# Patient Record
Sex: Female | Born: 1935
Health system: Southern US, Community
[De-identification: ages and names within clinical notes are randomized; demographics above are authoritative.]

## PROBLEM LIST (undated history)

## (undated) DIAGNOSIS — I10 Essential (primary) hypertension: Secondary | ICD-10-CM

## (undated) DIAGNOSIS — Z9114 Patient's other noncompliance with medication regimen: Secondary | ICD-10-CM

## (undated) DIAGNOSIS — Z91148 Patient's other noncompliance with medication regimen for other reason: Secondary | ICD-10-CM

---

## 2011-12-02 ENCOUNTER — Other Ambulatory Visit: Payer: Self-pay | Admitting: Family Medicine

## 2011-12-02 DIAGNOSIS — R1011 Right upper quadrant pain: Secondary | ICD-10-CM

## 2011-12-03 ENCOUNTER — Other Ambulatory Visit (HOSPITAL_COMMUNITY): Payer: Self-pay

## 2011-12-03 ENCOUNTER — Other Ambulatory Visit: Payer: Self-pay

## 2011-12-05 ENCOUNTER — Other Ambulatory Visit: Payer: Self-pay | Admitting: Family Medicine

## 2011-12-05 ENCOUNTER — Ambulatory Visit
Admission: RE | Admit: 2011-12-05 | Discharge: 2011-12-05 | Disposition: A | Discharge status: Home / Self Care | Payer: PRIVATE HEALTH INSURANCE | Source: Ambulatory Visit | Attending: Family Medicine | Admitting: Family Medicine

## 2011-12-05 DIAGNOSIS — R1011 Right upper quadrant pain: Secondary | ICD-10-CM

## 2011-12-12 ENCOUNTER — Other Ambulatory Visit: Payer: Self-pay | Admitting: Family Medicine

## 2011-12-12 DIAGNOSIS — J984 Other disorders of lung: Secondary | ICD-10-CM

## 2011-12-16 ENCOUNTER — Ambulatory Visit
Admission: RE | Admit: 2011-12-16 | Discharge: 2011-12-16 | Disposition: A | Discharge status: Home / Self Care | Payer: PRIVATE HEALTH INSURANCE | Source: Ambulatory Visit | Attending: Family Medicine | Admitting: Family Medicine

## 2011-12-16 DIAGNOSIS — J984 Other disorders of lung: Secondary | ICD-10-CM

## 2011-12-16 MED ORDER — IOHEXOL 300 MG/ML  SOLN
75.0000 mL | Freq: Once | INTRAMUSCULAR | Status: AC | PRN
  Administered 2011-12-16: 75 mL via INTRAVENOUS

## 2013-07-05 ENCOUNTER — Other Ambulatory Visit: Payer: Self-pay | Admitting: Family Medicine

## 2013-07-05 DIAGNOSIS — I7121 Aneurysm of the ascending aorta, without rupture: Secondary | ICD-10-CM

## 2013-07-05 DIAGNOSIS — I712 Thoracic aortic aneurysm, without rupture: Secondary | ICD-10-CM

## 2013-07-14 ENCOUNTER — Other Ambulatory Visit: Payer: PRIVATE HEALTH INSURANCE

## 2014-01-09 ENCOUNTER — Encounter (HOSPITAL_COMMUNITY): Payer: Self-pay | Admitting: Emergency Medicine

## 2014-01-09 ENCOUNTER — Emergency Department (HOSPITAL_COMMUNITY): Payer: PRIVATE HEALTH INSURANCE

## 2014-01-09 ENCOUNTER — Emergency Department (INDEPENDENT_AMBULATORY_CARE_PROVIDER_SITE_OTHER)
Admission: EM | Admit: 2014-01-09 | Discharge: 2014-01-09 | Disposition: A | Discharge status: Nursing Home (Includes ICF) | Payer: PRIVATE HEALTH INSURANCE | Source: Home / Self Care | Attending: Family Medicine | Admitting: Family Medicine

## 2014-01-09 ENCOUNTER — Emergency Department (HOSPITAL_COMMUNITY)
Admission: EM | Admit: 2014-01-09 | Discharge: 2014-01-09 | Disposition: A | Discharge status: Home / Self Care | Payer: PRIVATE HEALTH INSURANCE | Attending: Emergency Medicine | Admitting: Emergency Medicine

## 2014-01-09 DIAGNOSIS — I10 Essential (primary) hypertension: Secondary | ICD-10-CM | POA: Insufficient documentation

## 2014-01-09 DIAGNOSIS — R509 Fever, unspecified: Secondary | ICD-10-CM | POA: Diagnosis not present

## 2014-01-09 DIAGNOSIS — M47812 Spondylosis without myelopathy or radiculopathy, cervical region: Secondary | ICD-10-CM

## 2014-01-09 DIAGNOSIS — M542 Cervicalgia: Secondary | ICD-10-CM | POA: Diagnosis present

## 2014-01-09 DIAGNOSIS — M5137 Other intervertebral disc degeneration, lumbosacral region: Secondary | ICD-10-CM | POA: Insufficient documentation

## 2014-01-09 DIAGNOSIS — J189 Pneumonia, unspecified organism: Secondary | ICD-10-CM

## 2014-01-09 LAB — CBC WITH DIFFERENTIAL/PLATELET
BASOS ABS: 0 10*3/uL (ref 0.0–0.1)
BASOS PCT: 0 % (ref 0–1)
EOS ABS: 0 10*3/uL (ref 0.0–0.7)
Eosinophils Relative: 0 % (ref 0–5)
HCT: 40.3 % (ref 36.0–46.0)
Hemoglobin: 13 g/dL (ref 12.0–15.0)
Lymphocytes Relative: 14 % (ref 12–46)
Lymphs Abs: 1.3 10*3/uL (ref 0.7–4.0)
MCH: 22.3 pg — ABNORMAL LOW (ref 26.0–34.0)
MCHC: 32.3 g/dL (ref 30.0–36.0)
MCV: 69.2 fL — ABNORMAL LOW (ref 78.0–100.0)
Monocytes Absolute: 0.5 10*3/uL (ref 0.1–1.0)
Monocytes Relative: 5 % (ref 3–12)
NEUTROS ABS: 7.8 10*3/uL — AB (ref 1.7–7.7)
NEUTROS PCT: 81 % — AB (ref 43–77)
PLATELETS: 213 10*3/uL (ref 150–400)
RBC: 5.82 MIL/uL — ABNORMAL HIGH (ref 3.87–5.11)
RDW: 14 % (ref 11.5–15.5)
WBC: 9.7 10*3/uL (ref 4.0–10.5)

## 2014-01-09 LAB — BASIC METABOLIC PANEL
ANION GAP: 12 (ref 5–15)
BUN: 11 mg/dL (ref 6–23)
CO2: 27 mEq/L (ref 19–32)
Calcium: 9.4 mg/dL (ref 8.4–10.5)
Chloride: 97 mEq/L (ref 96–112)
Creatinine, Ser: 0.63 mg/dL (ref 0.50–1.10)
GFR calc Af Amer: >90 mL/min (ref >90)
GFR, EST NON AFRICAN AMERICAN: 84 mL/min — AB (ref >90)
Glucose, Bld: 97 mg/dL (ref 70–99)
POTASSIUM: 3.8 meq/L (ref 3.7–5.3)
SODIUM: 136 meq/L — AB (ref 137–147)

## 2014-01-09 MED ORDER — PREDNISONE 20 MG PO TABS: 20.0000 mg | ORAL_TABLET | Freq: Two times a day (BID) | ORAL | Status: DC

## 2014-01-09 MED ORDER — LISINOPRIL 10 MG PO TABS
10.0000 mg | ORAL_TABLET | Freq: Every day | ORAL | Status: DC
Start: 2014-01-09 — End: 2018-04-13

## 2014-01-09 NOTE — ED Provider Notes (Signed)
CSN: [id number]     Arrival date & time 01/09/14  1402 History   First MD Initiated Contact with Patient 01/09/14 1431     Chief Complaint  Patient presents with  . Neck Pain  . Fever     (Consider location/radiation/quality/duration/timing/severity/associated sxs/prior Treatment) Patient is a 80 y.o. female presenting with neck pain and fever. A language interpreter was used (Daughter).  Neck Pain Associated symptoms: fever   Fever  Tracy Kerr is a 80 y.o. female who is here for evaluation of pain in left neck, occasional cough, and evaluation after treatment for bronchitis, 2 weeks ago. She also has intermittent elevated blood pressure, which is not currently being treated. She denies fever. Currently, but did have some chills 2 weeks ago, at which time. She was treated for bronchitis with Zithromax. She's never had an injury to her neck. The pain in her neck is worse with movement to the left, with rotation. She denies weakness, dizziness, paresthesias, or problems walking. She was evaluated at an urgent care center, and sent here for further evaluation to be strained for pneumonia. She denies headache, blurred vision, nausea, or vomiting. She has not taken any medication for the discomfort. There are no other known modifying factors.    History reviewed. No pertinent past medical history. History reviewed. No pertinent past surgical history. History reviewed. No pertinent family history. History  Substance Use Topics  . Smoking status: Never Smoker   . Smokeless tobacco: Not on file  . Alcohol Use: No   OB History   Grav Para Term Preterm Abortions TAB SAB Ect Mult Living                 Review of Systems  Constitutional: Positive for fever.  Musculoskeletal: Positive for neck pain.  All other systems reviewed and are negative.     Allergies  Review of patient's allergies indicates no known allergies.  Home Medications   Prior to Admission medications   Not on File    BP 179/100  Pulse 98  Temp(Src) 98.7 F (37.1 C) (Oral)  Resp 20  SpO2 98% Physical Exam  Nursing note and vitals reviewed. Constitutional: She is oriented to person, place, and time. She appears well-developed and well-nourished. No distress.  HENT:  Head: Normocephalic and atraumatic.  Eyes: Conjunctivae and EOM are normal. Pupils are equal, round, and reactive to light.  Neck: Normal range of motion and phonation normal. Neck supple.  I neck is supple. She can touch her chin to her chest.  Cardiovascular: Normal rate and regular rhythm.   Pulmonary/Chest: Effort normal. She has rales (Bilateral bases). She exhibits no tenderness.  Abdominal: Soft. She exhibits no distension. There is no tenderness. There is no guarding.  Musculoskeletal: Normal range of motion.  There is no palpable tenderness of the cervical, thoracic, or lumbar spines. There is no paravertebral tenderness of the neck. Neck exhibits normal active range of motion.  Neurological: She is alert and oriented to person, place, and time. She exhibits normal muscle tone.  No dysarthria or aphasia.  Skin: Skin is warm and dry.  Psychiatric: She has a normal mood and affect. Her behavior is normal. Judgment and thought content normal.    ED Course  Procedures (including critical care time) Medications - No data to display  Patient Vitals for the past 24 hrs:  BP Temp Temp src Pulse Resp SpO2  01/09/14 1410 179/100 mmHg 98.7 F (37.1 C) Oral 98 20 98 %  Labs Review Labs Reviewed  CBC WITH DIFFERENTIAL  BASIC METABOLIC PANEL    Imaging Review No results found.   EKG Interpretation None      MDM   Final diagnoses:  Neck pain  Degenerative joint disease of cervical spine  Essential hypertension    Nonspecific neck pain, with evidence for djd as source.  Nursing Notes Reviewed/ Care Coordinated Applicable Imaging Reviewed Interpretation of Laboratory Data incorporated into ED  treatment  The patient appears reasonably screened and/or stabilized for discharge and I doubt any other medical condition or other [facility] requiring further screening, evaluation, or treatment in the ED at this time prior to discharge.  Plan: Home Medications- Prednisone; Home Treatments- Heat; return here if the recommended treatment, does not improve the symptoms; Recommended follow up- PCP 1 week    Flint MelterElliott L Tobey Schmelzle, MD 01/11/14 [phone]

## 2014-01-09 NOTE — ED Notes (Signed)
Patient c/o neck stiffness and pain on the left side of her body x 3 weeks. Patients daughter is translating for her. Reports pain originally was on the right side. Has not been taking anything for the pain. Patient is alert and oriented and in NAD.

## 2014-01-09 NOTE — Discharge Instructions (Signed)
Use heat on the sore area in her neck, to help treat the arthritis. He continues he did try heat from a heating pad, or wet heat using a moist compress. After the prednisone runs out, take ibuprofen 400 mg 3 times a day with meals for pain. Use the resource guide to find a primary care doctor to see for a blood pressure check in one to 2 weeks. Do not start taking her blood pressure medicine, and was advised to do so, by a physician.     T?ng huy?t p (Hypertension) T?ng huy?t p, th??ng ???c g?i l huy?t p cao, l khi l?c b?m mu qua ??ng m?ch c?a qu v? qu m?nh. ??ng m?ch c?a qu v? l cc m?ch mu mang mu t? tim ?i kh?p c? th? c?a qu v?. K?t qu? ?o huy?t p c m?t con s? cao v m?t con s? th?p, ch?ng h?n 110/72. Con s? cao (tm thu) l p l?c bn trong ??ng m?ch khi tim qu v? b?m. Con s? th?p (tm tr??ng) l p l?c bn trong ??ng m?ch khi tim qu v? gin ra. Huy?t p l t??ng c?n cho qu v? ph?i l d??i 120/80. Ch?ng t?ng huy?t p bu?c tim qu v? ph?i lm vi?c v?t v? h?n ?? b?m mu. ??ng m?ch c?a qu v? c th? b? h?p ho?c c?ng. Ch?ng t?ng huy?t p lm qu v? c nguy c? b? b?nh tim, ??t qu? v cc v?n ?? khc.  CC Y?U T? NGUY C? M?t s? y?u t? nguy c? d?n ??n huy?t p cao c th? ki?m sot ???c. M?t s? y?u t? khc th khng.  Nh?ng y?u t? nguy c? khng th? ki?m sot ???c bao g?m:   Ch?ng t?c. Qu v? c nguy c? cao h?n n?u qu v? l ng??i M? g?c Phi.  ?? tu?i. Nguy c? t?ng ln theo ?? tu?i.  Gi?i tnh. Nam gi?i c nguy c? cao h?n ph? n? tr??c tu?i 45. Sau tu?i 67, ph? n? c nguy c? cao h?n nam gi?i. Nh?ng y?u t? nguy c? c th? ki?m sot ???c bao g?m:  Khng t?p th? d?c ho?c cc ho?t ??ng th? ch?t ??y ??Marland Kitchen  Th?a cn.  ?n qu nhi?u ch?t bo, ???ng, ca-lo, ho?c mu?i.  U?ng qu nhi?u r??u. D?U HI?U V TRI?U CH?NG T?ng huy?t p th??ng khng gy ra d?u hi?u ho?c tri?u ch?ng. Huy?t p r?t cao (c?n cao huy?t p) c th? gy ?au ??u, lo l?ng, kh th?, v ch?y mu cam. CH?N ?ON  ??  ki?m tra xem qu v? c t?ng huy?t p khng, chuyn gia ch?m [place] s?c kh?e c?a qu v? s? ?o huy?t p trong khi qu v? ng?i ??t tay ? m?c ngang v?i tim. Huy?t p c?n ???c ?o t nh?t hai l?n trn cng m?t cnh tay. M?t s? tnh tr?ng nh?t ??nh c th? lm cho huy?t p khc nhau gi?a tay ph?i v tay tri c?a qu v?. K?t qu? ?o huy?t p cao h?n bnh th??ng ? m?t th?i ?i?m no ? khng c ngh?a l qu v? c?n ?i?u tr?Marland Kitchen N?u k?t qu? ?o huy?t p cao, hy h?i chuyn gia ch?m [place] s?c kh?e v? vi?c ki?m tra l?i huy?t p. ?I?U TR?  ?i?u tr? huy?t p cao gao g?m thay ??i l?i s?ng v c th? ph?i dng thu?c. C m?t l?i s?ng lnh m?nh c th? gip lm gi?m huy?t p cao. Qu v? c th? c?n thay ??i m?t s? thi quen. Thay ??  i l?i s?ng c th? bao g?m:  Th?c hi?n ch? ?? ?n DASH. Ch? ?? ?n ny c nhi?u tri cy, rau, v ng? c?c nguyn h?t. C t mu?i, th?t ??, v t b? sung ???ng.  Hy dnh 2 1/2 ti?ng ho?t ??ng thn th? nhanh m?i tu?n.  Gi?m cn n?u c?n thi?t.  Khng ht thu?c.  H?n ch? ?? u?ng c c?n.  H?c cc cch gi?m c?ng th?ng. N?u thay ??i l?i s?ng khng ?? ?? ??a huy?t p v? m?c c th? ki?m sot ???c, chuyn gia ch?m [place] s?c kh?e c th? k ??n thu?c. Qu v? c th? c?n dng nhi?u lo?i thu?c. Ph?i h?p ch?t ch? v?i chuyn gia ch?m [place] s?c kh?e ?? tm hi?u cc nguy c? v l?i ch. H??NG D?N CH?M [place] T?I NH  Ki?m tra l?i huy?t p c?a qu v? theo ch? d?n c?a chuyn gia ch?m [place] s?c kh?e.  Ch? s? d?ng thu?c theo ch? d?n c?a chuyn gia ch?m [place] s?c kh?e. Lm theo ch? d?n m?t cch c?n th?n. Thu?c ?i?u tr? huy?t p ph?i ???c dng theo ??n ? k. Thu?c c?ng s? khng c tc d?ng khi qu v? b? li?u. Vi?c b? li?u thu?c c?ng lm qu v? c nguy c? pht sinh v?n ??Imagene Sheller ht thu?c.  Theo di huy?t p c?a qu v? ? nh theo ch? d?n c?a chuyn gia ch?m [place] s?c kh?e. ?I KHM N?U:   Qu v? ngh? qu v? c ph?n ?ng v?i thu?c ?ang dng.  Qu v? b? ?au ??u ho?c c?m th?y chng m?t ti di?n.  Qu v? b? s?ng ph ? m?t c  chn.  Qu v? c v?n ?? v? th? l?c. NGAY L?P T?C ?I KHM N?U:  Qu v? b? ?au ??u n?ng ho?c l l?n.  Qu v? b? y?u b?t th??ng, t b, ho?c c?m th?y nh? ng?t x?u.  Qu v? b? ?au ng?c ho?c ?au b?ng r?t nhi?u.  Qu v? nn nhi?u l?n.  Qu v? b? kh th?. ??M B?O QU V?:   Hi?u r cc h??ng d?n ny.  S? theo di tnh tr?ng c?a mnh.  S? yu c?u tr? gip ngay l?p t?c n?u qu v? c?m th?y khng kh?e ho?c th?y tr?m tr?ng h?n. Document Released: 03/25/2005 Document Revised: 08/09/2013 [employer] Patient Information 2015 [place], [place]. This information is not intended to replace advice given to you by your health care provider. Make sure you discuss any questions you have with your health care provider.    Emergency Department Resource Guide 1) Find a Doctor and Pay Out of Pocket Although you won't have to find out who is covered by your insurance plan, it is a good idea to ask around and get recommendations. You will then need to call the office and see if the doctor you have chosen will accept you as a new patient and what types of options they offer for patients who are self-pay. Some doctors offer discounts or will set up payment plans for their patients who do not have insurance, but you will need to ask so you aren't surprised when you get to your appointment.  2) Contact Your Local Health Department Not all health departments have doctors that can see patients for sick visits, but many do, so it is worth a call to see if yours does. If you don't know where your local health department is, you can check in your phone book. The CDC also has a tool to help you  locate your state's health department, and many state websites also have listings of all of their local health departments.  3) Find a Walk-in Clinic If your illness is not likely to be very severe or complicated, you may want to try a walk in clinic. These are popping up all over the country in pharmacies, drugstores, and shopping  centers. They're usually staffed by nurse practitioners or physician assistants that have been trained to treat common illnesses and complaints. They're usually fairly quick and inexpensive. However, if you have serious medical issues or chronic medical problems, these are probably not your best option.  No Primary Care Doctor: - Call Health Connect at  [phone] - they can help you locate a primary care doctor that  accepts your insurance, provides certain services, etc. - Physician Referral Service- [phone]  Chronic Pain Problems: Organization         Address  Phone   Notes  [facility]  [phone] Patients need to be referred by their primary care doctor.   Medication Assistance: Organization         Address  Phone   Notes  [facility] Medication [facility] [address] [place] [place], [place] [address] [phone] --Must be a resident of [facility] -- Must have NO insurance coverage whatsoever (no Medicaid/ Medicare, etc.) -- The pt. MUST have a primary care doctor that directs their care regularly and follows them in the community   MedAssist  [phone]   [employer]  [phone]    Agencies that provide inexpensive medical care: Organization         Address  Phone   Notes  [facility]  [phone]   [facility]    [phone]   [facility] [address] [place], [place] [address] [phone]   [facility] [address] [place] [place] [phone]   Planned Parenthood    [phone]   [place] [facility]    [phone]   [facility]  [address], [place] Phone:  [phone], Fax:  [phone] Hours of Operation:  9 am - 6 pm, M-F.  Also accepts Medicaid/Medicare and self-pay.  [facility] for Children  [address], [place] Phone: [phone], Fax: [phone]. Hours of Operation:  8:30 am - 5:30 pm, M-F.  Also accepts Medicaid and self-pay.  [facility] [place] [address], [place] Phone: [phone]   [facility] [address] Natasha Bence [place], [place] [phone], Ext. [phone] Mondays & Thursdays: 7-9 AM.  First 15 patients are seen on a first come, first serve basis.    Medicaid-accepting [facility] Providers:  Organization         Address  Phone   Notes  [facility] [address], [place] [phone] Also accepts self-pay patients.  [facility] [address] Laurell Josephs [place], [place]  [phone]   [facility] [address], [place] [phone]   [facility] [address], [place] [phone]   Renaye Rakers [address], [place]   [phone] Only accepts [place] Access [place] patients after they have their name applied to their card.   Self-Pay (no insurance) in [facility]:  Organization  Address  Phone   Notes  Sickle Cell Patients, [facility] [address], [place] [phone]   [facility] [address], [place] [phone]   [facility] [place]  [address], [place] [phone]) [phone]   [facility]/Dr. Osei-Bonsu  [address] [place] [address], [place] or [phone], [place] [phone] Phone number for both [place] and [place] locations is the same.  [facility] [address], [place] [phone]   [facility] [place] [address] [place] [address], [place] or [address] [phone]   [facility] [address], [place] [phone], phone; [phone]) [phone], fax Sees patients 1st and 3rd Saturday of every month.  Must not qualify for public or private insurance (i.e.  Medicaid, Medicare, [place] Health Choice, Veterans' Benefits)  Household income should be no more than 200% of the poverty level The clinic cannot treat you if you are pregnant or think you are pregnant  Sexually transmitted diseases are not treated at the clinic.    Dental Care: Organization         Address  Phone  Notes  [facility] [address], [place] [phone] Accepts children up to age 78 who are enrolled in [place]icaid or [place] Health Choice; pregnant women with a Medicaid card; and children who have applied for Medicaid or [place] Health Choice, but were declined, whose parents can pay a reduced fee at time of service.  [facility] [place]  [address], [place] [phone] Accepts children up to age 78 who are enrolled in [place] or [place] Health Choice; pregnant women with a Medicaid card; and children who have applied for Medicaid or [place] Health Choice, but were declined, whose parents can pay a reduced fee at time of service.  [place] [facility]  [address], [place] [phone]) [phone] Patients are seen by appointment only. Walk-ins are not accepted. [place] [facility] will see patients 78 years of age and older. Monday - Tuesday (8am-5pm) Most Wednesdays (8:30-5pm) $30 per visit, cash only  [facility]  [address], [address] [place] [phone]) [phone] Patients are seen by appointment only. Walk-ins are not accepted. [place] will see patients 78 years of age and older. One Wednesday Evening (Monthly: Volunteer Based).  $30 per visit, cash only  [employer]  [phone] for adults; Children under age 674, call Graduate Pediatric Dentistry at [phone]. Children aged 374-14, please call [phone] to request a pediatric application.  Dental services are provided in all areas of dental care including fillings,  crowns and bridges, complete and partial dentures, implants, gum treatment, root canals, and extractions. Preventive care is also provided. Treatment is provided to both adults and children. Patients are selected via a lottery and there is often a waiting list.   [facility] [address], [place]  [phone] [email or web address]   [facility] [address], [place], [place] [phone], Ext. [phone] Second and Fourth Thursday of each month, opens at 6:30 AM; Clinic ends at 9 AM.  Patients are seen on a first-come first-served basis, and a limited number are seen during each clinic.   [facility]  [address], [place]  [place], [place] [phone]   Eligibility Requirements You must have lived in [place], [place], or [place] counties for at least the last three months.   You cannot be eligible for state or federal sponsored [employer], including [employer], [place], or [employer].   You generally cannot be eligible for healthcare insurance through your employer.    How to apply: Eligibility screenings are held every Tuesday and Wednesday afternoon from 1:00 pm until 4:00 pm. You do not need an appointment for the interview!  [facility] [address], [place], [place]   [place]  [place] Department  [place]  [phone]    Behavioral Health Resources in the Community: Intensive Outpatient Programs Organization         Address  Phone  Notes  [facility]. [address] [place], [place] [phone]   [facility] Outpatient [address] [place], [place]   ADS: [facility] [address] [place], [place]   [employer] [address],  [place], [place] or [phone]   Substance Abuse  Resources Organization         Address  Phone  Notes  Alcohol and Drug Services  [phone]   [place]  [phone]   [facility] [place]   Chinita Pester  [phone]   Residential & Outpatient Substance Abuse Program  [phone]   Psychological Services Organization         Address  Phone  Notes  [facility] [place]  [place]  [phone]   [place] [address], [place] or [phone]    Mobile Crisis Teams Organization         Address  Phone  Notes  [facility], Mobile Crisis Care Unit  [phone]   [facility]  [address] [place], [place]   [place] [address], [place] [phone]    Self-Help/Support Groups Organization         Address  Phone             Notes  [place]. of [place] - variety of support groups  [place] Call for more information  Narcotics Anonymous (NA), [facility] [address], [employer] [place]  2 meetings at this location   Special educational needs teacher         Address  Phone  Notes  ASAP Residential Treatment [place],    [place]  [phone]   [facility]  [address] [place] [address], [place], [place]   [place], [place] [phone] Admissions: 8am-3pm M-F  Incentives Substance [place] [address]    [place], [place] [phone]   [facility] [address] Jadene Pierini [place], [place]   [facility] [address],  [place], [place]   [facility] - Intensive Outpatient [address] Kristeen [address], [place], [place]   [facility] ([place].) [place].,  [place], [place] or [phone]   [facility] ([facility]) [address], [place], [place] Accepts Medicaid  [facility] [address]  [place] [phone] Substance Abuse/Addiction Treatment   [facility] Resources Organization  Address  Phone  Notes  °[facility]  [phone]   °Julie Brannon, PhD [address] [place], [place]   [phone] or [phone]   °[facility]   [address] °[place], [place] [phone]   °[facility] [address], [place], [place] [phone] Insurance/Medicaid/sponsorship through [facility]  °[facility] [address]                                    [place], [place] [phone] Therapy/tele-psych/case  °[facility] [address]  ° [place], [place] [phone]    °Dr. Arfeen  [phone]   °[facility] [place]  [employer] [place] [facility] 1) [address], [place] °2) [address], [place] °3)  [address], [place] [phone] °[phone] ° °([phone]   °[place] Child Abuse Hotline [phone]) [phone] or [phone] (After Hours)    ° ° °

## 2014-01-09 NOTE — ED Provider Notes (Signed)
CSN: [id number]     Arrival date & time 01/09/14  1227 History   First MD Initiated Contact with Patient 01/09/14 1254     Chief Complaint  Patient presents with  . Neck Pain   (Consider location/radiation/quality/duration/timing/severity/associated sxs/prior Treatment) Patient is a 80 y.o. female presenting with neck pain. The history is provided by the patient and a relative.  Neck Pain Pain location:  L side Quality:  Stiffness Chronicity:  New Context comment:  Sick for 3 weeks, given z-pack and cough med by lmd but sx continue, fever, malaise. Associated symptoms: fever     History reviewed. No pertinent past medical history. History reviewed. No pertinent past surgical history. No family history on file. History  Substance Use Topics  . Smoking status: Never Smoker   . Smokeless tobacco: Not on file  . Alcohol Use: No   OB History   Grav Para Term Preterm Abortions TAB SAB Ect Mult Living                 Review of Systems  Constitutional: Positive for fever, activity change and appetite change.  HENT: Negative.   Respiratory: Positive for cough.   Cardiovascular: Negative.   Musculoskeletal: Positive for neck pain.    Allergies  Review of patient's allergies indicates no known allergies.  Home Medications   Prior to Admission medications   Not on File   BP 179/110  Pulse 99  Temp(Src) 98.9 F (37.2 C) (Oral)  Resp 16  SpO2 99% Physical Exam  Nursing note and vitals reviewed. Constitutional: She is oriented to person, place, and time. She appears well-developed and well-nourished.  HENT:  Right Ear: External ear normal.  Left Ear: External ear normal.  Mouth/Throat: Oropharynx is clear and moist.  Eyes: Pupils are equal, round, and reactive to light.  Neck: Normal range of motion. Neck supple.  Cardiovascular: Regular rhythm and normal heart sounds.   Pulmonary/Chest: She has decreased breath sounds. She has rales in the right upper field, the right  middle field, the right lower field, the left upper field, the left middle field and the left lower field.  Lymphadenopathy:    She has no cervical adenopathy.  Neurological: She is alert and oriented to person, place, and time.  Skin: Skin is warm and dry.    ED Course  Procedures (including critical care time) Labs Review Labs Reviewed - No data to display  Imaging Review No results found.   MDM   1. CAP (community acquired pneumonia)    Sent for eval of poss cap, sick for 3 wks with reported fever and generalized fatigue. Rales on exam, ecg wnl. hbp untreated.    Linna HoffJames D Kindl, MD 01/09/14 1318

## 2014-01-09 NOTE — ED Notes (Signed)
Pt having neck stiffness, fatigue, cough and left side body pain x 1 week and not improving. [facility]

## 2014-05-31 ENCOUNTER — Other Ambulatory Visit: Payer: Self-pay | Admitting: Family Medicine

## 2014-05-31 DIAGNOSIS — I712 Thoracic aortic aneurysm, without rupture, unspecified: Secondary | ICD-10-CM

## 2014-06-06 ENCOUNTER — Other Ambulatory Visit: Payer: Medicaid Other

## 2015-12-15 ENCOUNTER — Other Ambulatory Visit: Payer: Self-pay | Admitting: Internal Medicine

## 2015-12-15 DIAGNOSIS — Z1231 Encounter for screening mammogram for malignant neoplasm of breast: Secondary | ICD-10-CM

## 2015-12-26 ENCOUNTER — Other Ambulatory Visit: Payer: Self-pay | Admitting: Internal Medicine

## 2015-12-26 DIAGNOSIS — R5381 Other malaise: Secondary | ICD-10-CM

## 2015-12-26 DIAGNOSIS — Z0000 Encounter for general adult medical examination without abnormal findings: Secondary | ICD-10-CM

## 2015-12-27 ENCOUNTER — Other Ambulatory Visit: Payer: Self-pay | Admitting: Internal Medicine

## 2015-12-27 DIAGNOSIS — Z780 Asymptomatic menopausal state: Secondary | ICD-10-CM

## 2016-02-19 DIAGNOSIS — M25461 Effusion, right knee: Secondary | ICD-10-CM | POA: Diagnosis not present

## 2016-02-19 DIAGNOSIS — M25462 Effusion, left knee: Secondary | ICD-10-CM | POA: Diagnosis not present

## 2016-02-19 DIAGNOSIS — M1711 Unilateral primary osteoarthritis, right knee: Secondary | ICD-10-CM | POA: Diagnosis not present

## 2016-02-19 DIAGNOSIS — M1712 Unilateral primary osteoarthritis, left knee: Secondary | ICD-10-CM | POA: Diagnosis not present

## 2018-04-13 ENCOUNTER — Encounter (HOSPITAL_BASED_OUTPATIENT_CLINIC_OR_DEPARTMENT_OTHER): Payer: Self-pay | Admitting: Emergency Medicine

## 2018-04-13 ENCOUNTER — Inpatient Hospital Stay (HOSPITAL_BASED_OUTPATIENT_CLINIC_OR_DEPARTMENT_OTHER)
Admission: EM | Admit: 2018-04-13 | Discharge: 2018-04-16 | DRG: 871 | Disposition: A | Discharge status: Home / Self Care | Payer: Medicare Other | Attending: Internal Medicine | Admitting: Internal Medicine

## 2018-04-13 ENCOUNTER — Emergency Department (HOSPITAL_BASED_OUTPATIENT_CLINIC_OR_DEPARTMENT_OTHER): Payer: Medicare Other

## 2018-04-13 ENCOUNTER — Other Ambulatory Visit: Payer: Self-pay

## 2018-04-13 ENCOUNTER — Emergency Department (HOSPITAL_COMMUNITY)
Admission: EM | Admit: 2018-04-13 | Discharge: 2018-04-13 | Disposition: A | Discharge status: Home / Self Care | Payer: Self-pay

## 2018-04-13 DIAGNOSIS — Z9114 Patient's other noncompliance with medication regimen: Secondary | ICD-10-CM | POA: Diagnosis not present

## 2018-04-13 DIAGNOSIS — J209 Acute bronchitis, unspecified: Secondary | ICD-10-CM | POA: Diagnosis present

## 2018-04-13 DIAGNOSIS — Y95 Nosocomial condition: Secondary | ICD-10-CM | POA: Diagnosis present

## 2018-04-13 DIAGNOSIS — E876 Hypokalemia: Secondary | ICD-10-CM | POA: Diagnosis not present

## 2018-04-13 DIAGNOSIS — J45909 Unspecified asthma, uncomplicated: Secondary | ICD-10-CM | POA: Diagnosis present

## 2018-04-13 DIAGNOSIS — Z9119 Patient's noncompliance with other medical treatment and regimen: Secondary | ICD-10-CM | POA: Diagnosis not present

## 2018-04-13 DIAGNOSIS — A419 Sepsis, unspecified organism: Secondary | ICD-10-CM | POA: Diagnosis not present

## 2018-04-13 DIAGNOSIS — I10 Essential (primary) hypertension: Secondary | ICD-10-CM | POA: Diagnosis present

## 2018-04-13 DIAGNOSIS — J189 Pneumonia, unspecified organism: Secondary | ICD-10-CM | POA: Diagnosis present

## 2018-04-13 DIAGNOSIS — K4020 Bilateral inguinal hernia, without obstruction or gangrene, not specified as recurrent: Secondary | ICD-10-CM | POA: Diagnosis not present

## 2018-04-13 DIAGNOSIS — R918 Other nonspecific abnormal finding of lung field: Secondary | ICD-10-CM | POA: Diagnosis not present

## 2018-04-13 HISTORY — DX: Essential (primary) hypertension: I10

## 2018-04-13 HISTORY — DX: Patient's other noncompliance with medication regimen for other reason: Z91.148

## 2018-04-13 HISTORY — DX: Patient's other noncompliance with medication regimen: Z91.14

## 2018-04-13 LAB — URINALYSIS, ROUTINE W REFLEX MICROSCOPIC
Glucose, UA: NEGATIVE mg/dL
KETONES UR: 40 mg/dL — AB
Leukocytes, UA: NEGATIVE
NITRITE: NEGATIVE
Protein, ur: 100 mg/dL — AB
Specific Gravity, Urine: 1.025 (ref 1.005–1.030)
pH: 6 (ref 5.0–8.0)

## 2018-04-13 LAB — CBC WITH DIFFERENTIAL/PLATELET
Abs Immature Granulocytes: 0.07 10*3/uL (ref 0.00–0.07)
Basophils Absolute: 0 10*3/uL (ref 0.0–0.1)
Basophils Relative: 0 %
EOS ABS: 0 10*3/uL (ref 0.0–0.5)
Eosinophils Relative: 0 %
HEMATOCRIT: 47.1 % — AB (ref 36.0–46.0)
Hemoglobin: 13.7 g/dL (ref 12.0–15.0)
IMMATURE GRANULOCYTES: 1 %
Lymphocytes Relative: 9 %
Lymphs Abs: 1.3 10*3/uL (ref 0.7–4.0)
MCH: 21.9 pg — ABNORMAL LOW (ref 26.0–34.0)
MCHC: 29.1 g/dL — ABNORMAL LOW (ref 30.0–36.0)
MCV: 75.4 fL — ABNORMAL LOW (ref 80.0–100.0)
Monocytes Absolute: 0.8 10*3/uL (ref 0.1–1.0)
Monocytes Relative: 5 %
NEUTROS PCT: 85 %
Neutro Abs: 12.7 10*3/uL — ABNORMAL HIGH (ref 1.7–7.7)
PLATELETS: 224 10*3/uL (ref 150–400)
RBC: 6.25 MIL/uL — AB (ref 3.87–5.11)
RDW: 18.1 % — AB (ref 11.5–15.5)
WBC: 14.9 10*3/uL — AB (ref 4.0–10.5)
nRBC: 0 % (ref 0.0–0.2)

## 2018-04-13 LAB — COMPREHENSIVE METABOLIC PANEL
ALT: 31 U/L (ref 0–44)
AST: 47 U/L — ABNORMAL HIGH (ref 15–41)
Albumin: 3.2 g/dL — ABNORMAL LOW (ref 3.5–5.0)
Alkaline Phosphatase: 125 U/L (ref 38–126)
Anion gap: 13 (ref 5–15)
BUN: 15 mg/dL (ref 8–23)
CO2: 26 mmol/L (ref 22–32)
Calcium: 9.2 mg/dL (ref 8.9–10.3)
Chloride: 92 mmol/L — ABNORMAL LOW (ref 98–111)
Creatinine, Ser: 0.66 mg/dL (ref 0.44–1.00)
GFR calc Af Amer: >60 mL/min (ref >60)
Glucose, Bld: 86 mg/dL (ref 70–99)
Potassium: 4 mmol/L (ref 3.5–5.1)
Sodium: 131 mmol/L — ABNORMAL LOW (ref 135–145)
Total Bilirubin: 1.7 mg/dL — ABNORMAL HIGH (ref 0.3–1.2)
Total Protein: 8.4 g/dL — ABNORMAL HIGH (ref 6.5–8.1)

## 2018-04-13 LAB — I-STAT CG4 LACTIC ACID, ED
LACTIC ACID, VENOUS: 2.15 mmol/L — AB (ref 0.5–1.9)
Lactic Acid, Venous: 1.2 mmol/L (ref 0.5–1.9)

## 2018-04-13 LAB — URINALYSIS, MICROSCOPIC (REFLEX): RBC / HPF: >50 RBC/hpf (ref 0–5)

## 2018-04-13 MED ORDER — IBUPROFEN 400 MG PO TABS
600.0000 mg | ORAL_TABLET | Freq: Once | ORAL | Status: AC
  Administered 2018-04-13: 600 mg via ORAL

## 2018-04-13 MED ORDER — IBUPROFEN 100 MG/5ML PO SUSP
ORAL | Status: AC
  Filled 2018-04-13: qty 30

## 2018-04-13 MED ORDER — ACETAMINOPHEN 650 MG RE SUPP
RECTAL | Status: AC
  Filled 2018-04-13: qty 1

## 2018-04-13 MED ORDER — SODIUM CHLORIDE 0.9 % IV SOLN
1.0000 g | INTRAVENOUS | Status: DC
  Administered 2018-04-13: 1 g via INTRAVENOUS
  Filled 2018-04-13: qty 10

## 2018-04-13 MED ORDER — SODIUM CHLORIDE 0.9 % IV BOLUS (SEPSIS)
250.0000 mL | Freq: Once | INTRAVENOUS | Status: AC
  Administered 2018-04-13: 850 mL via INTRAVENOUS

## 2018-04-13 MED ORDER — SODIUM CHLORIDE 0.9 % IV BOLUS (SEPSIS)
1000.0000 mL | Freq: Once | INTRAVENOUS | Status: AC
  Administered 2018-04-13: 1000 mL via INTRAVENOUS

## 2018-04-13 MED ORDER — ACETAMINOPHEN 650 MG RE SUPP
650.0000 mg | Freq: Once | RECTAL | Status: AC
  Administered 2018-04-13: 650 mg via RECTAL

## 2018-04-13 MED ORDER — SODIUM CHLORIDE 0.9 % IV BOLUS (SEPSIS)
500.0000 mL | Freq: Once | INTRAVENOUS | Status: AC
  Administered 2018-04-13: 500 mL via INTRAVENOUS

## 2018-04-13 MED ORDER — IOPAMIDOL (ISOVUE-300) INJECTION 61%
100.0000 mL | Freq: Once | INTRAVENOUS | Status: AC | PRN
  Administered 2018-04-13: 100 mL via INTRAVENOUS

## 2018-04-13 NOTE — ED Notes (Signed)
Date and time results received: 04/13/18 2147 (use smartphrase ".now" to insert current time)  Test:ISTAT Lactic Acid Critical Value: 2.15  Name of Provider Notified: Dr. Para Skeans  Orders Received? Or Actions Taken?:

## 2018-04-13 NOTE — ED Notes (Signed)
Attempted IV in left AC and left wrist unsuccessful.

## 2018-04-13 NOTE — ED Notes (Signed)
ED Provider at bedside. 

## 2018-04-13 NOTE — ED Provider Notes (Signed)
[facility] EMERGENCY DEPARTMENT Provider Note   CSN: [id number] Arrival date & time: 04/13/18  2116     History   Chief Complaint Chief Complaint  Patient presents with  . Cough    HPI Tracy Kerr is a 85 y.o. female.  Pt presents to the ED today with fever and not feeling well.  The pt speaks [place] only and her daughter translates.  The daughter said pt did not want to get up today at all.  She has not eaten and has been sleeping all day.  The patient has not had anything for fever.  No known sick contacts.  She complains of vague pain all over.  No n/v/d.     Past Medical History:  Diagnosis Date  . Hypertension   . Noncompliance with medications     There are no active problems to display for this patient.   No past surgical history on file.   OB History   No obstetric history on file.      Home Medications    Prior to Admission medications   Not on File    Family History No family history on file.  Social History Social History   Tobacco Use  . Smoking status: Never Smoker  Substance Use Topics  . Alcohol use: No  . Drug use: No     Allergies   Patient has no known allergies.   Review of Systems Review of Systems  Constitutional: Positive for activity change, appetite change, fatigue and fever.  All other systems reviewed and are negative.    Physical Exam Updated Vital Signs BP (!) 136/98   Pulse (!) 101   Temp 98.2 F (36.8 C) (Oral)   Resp 16   Ht 5\' 2"  (1.575 m)   Wt 54.4 kg   SpO2 98%   BMI 21.95 kg/m   Physical Exam Vitals signs and nursing note reviewed.  Constitutional:      Appearance: Normal appearance. She is normal weight.  HENT:     Head: Normocephalic and atraumatic.     Right Ear: External ear normal.     Left Ear: External ear normal.     Mouth/Throat:     Mouth: Mucous membranes are dry.  Eyes:     Extraocular Movements: Extraocular movements intact.     Pupils: Pupils are equal, round,  and reactive to light.  Neck:     Musculoskeletal: Normal range of motion and neck supple.  Cardiovascular:     Rate and Rhythm: Regular rhythm. Tachycardia present.     Pulses: Normal pulses.     Heart sounds: Normal heart sounds.  Pulmonary:     Effort: Pulmonary effort is normal.     Breath sounds: Normal breath sounds.  Abdominal:     General: Abdomen is flat. Bowel sounds are normal.     Palpations: Abdomen is soft.  Musculoskeletal: Normal range of motion.  Skin:    General: Skin is warm and dry.     Capillary Refill: Capillary refill takes less than 2 seconds.  Neurological:     General: No focal deficit present.     Mental Status: She is alert and oriented to person, place, and time.  Psychiatric:        Mood and Affect: Mood normal.        Behavior: Behavior normal.      ED Treatments / Results  Labs (all labs ordered are listed, but only abnormal results are displayed) Labs Reviewed  COMPREHENSIVE METABOLIC PANEL - Abnormal; Notable for the following components:      Result Value   Sodium 131 (*)    Chloride 92 (*)    Total Protein 8.4 (*)    Albumin 3.2 (*)    AST 47 (*)    Total Bilirubin 1.7 (*)    All other components within normal limits  CBC WITH DIFFERENTIAL/PLATELET - Abnormal; Notable for the following components:   WBC 14.9 (*)    RBC 6.25 (*)    HCT 47.1 (*)    MCV 75.4 (*)    MCH 21.9 (*)    MCHC 29.1 (*)    RDW 18.1 (*)    Neutro Abs 12.7 (*)    All other components within normal limits  URINALYSIS, ROUTINE W REFLEX MICROSCOPIC - Abnormal; Notable for the following components:   APPearance HAZY (*)    Hgb urine dipstick LARGE (*)    Bilirubin Urine SMALL (*)    Ketones, ur 40 (*)    Protein, ur 100 (*)    All other components within normal limits  URINALYSIS, MICROSCOPIC (REFLEX) - Abnormal; Notable for the following components:   Bacteria, UA FEW (*)    All other components within normal limits  I-STAT CG4 LACTIC ACID, ED - Abnormal;  Notable for the following components:   Lactic Acid, Venous 2.15 (*)    All other components within normal limits  CULTURE, BLOOD (ROUTINE X 2)  CULTURE, BLOOD (ROUTINE X 2)  URINE CULTURE  I-STAT CG4 LACTIC ACID, ED    EKG EKG Interpretation  Date/Time:  Monday April 13 2018 21:41:15 EST Ventricular Rate:  109 PR Interval:    QRS Duration: 74 QT Interval:  337 QTC Calculation: 454 R Axis:   24 Text Interpretation:  Sinus tachycardia Atrial premature complex Borderline prolonged PR interval Biatrial enlargement LVH with secondary repolarization abnormality Minimal ST elevation, inferior leads Since last tracing rate faster Confirmed by Jacalyn Lefevre [phone]) on 04/13/2018 11:12:21 PM   Radiology Ct Chest W Contrast  Result Date: 04/14/2018 CLINICAL DATA:  Acute resp illness, > 22 years old; Abd pain, acute, generalized. EXAM: CT CHEST, ABDOMEN, AND PELVIS WITH CONTRAST TECHNIQUE: Multidetector CT imaging of the chest, abdomen and pelvis was performed following the standard protocol during bolus administration of intravenous contrast. CONTRAST:  ISOVUE-300 IOPAMIDOL (ISOVUE-300) INJECTION 61% COMPARISON:  Chest radiograph earlier this day.  Chest CT 12/16/2011 FINDINGS: CT CHEST FINDINGS Cardiovascular: Ectasia of the ascending thoracic aorta at 4 cm, unchanged. Mild aortic atherosclerosis. No dissection. There is an aberrant small branch vessel from the proximal aspect of the descending aorta with serpiginous mediastinal collaterals extending to the subcarinal region and both hila, right greater than left. Vessel hypertrophy since prior exam, with numerous small perihilar and subcarinal vessels. No filling defects in the central pulmonary arteries to suggest pulmonary embolus. Mild cardiomegaly. There are coronary artery calcifications. No pericardial effusion. The IVC is patent. Mediastinum/Nodes: Multiple small reactive appearing lymph nodes throughout the mediastinum, all  subcentimeter. Patulous esophagus as before. No dominant thyroid nodule. Lungs/Pleura: Central bronchial narrowing appears most severe involving the right upper lobe. There is associated bronchial thickening, multifocal septal thickening, ground-glass opacity, and peribronchovascular nodularity. Areas of architectural distortion in the right upper lobe again seen. Volume loss in the right middle lobe. Focal airspace disease in the paramediastinal anterior right middle lobe. Fissural thickening along the minor fissure. Biapical pleuroparenchymal scarring. Small left and trace right pleural effusion. Retained mucus in the upper trachea.  Musculoskeletal: Minimal superior endplate compression fracture of T2 and T3, age indeterminate but likely chronic. No suspicious osseous abnormality. CT ABDOMEN PELVIS FINDINGS Hepatobiliary: No focal liver abnormality is seen. No gallstones, gallbladder wall thickening, or biliary dilatation. Pancreas: Mild pancreatic ductal prominence without peripancreatic inflammation or pancreatic mass. Spleen: Normal in size without focal abnormality. Adrenals/Urinary Tract: Normal adrenal glands. No hydronephrosis or perinephric edema. Homogeneous renal enhancement with symmetric excretion on delayed phase imaging. Urinary bladder is decompressed by Foley catheter. Stomach/Bowel: Bowel evaluation is limited by the absence of enteric contrast and paucity of intra-abdominal fat. Diffuse wall thickening of the gastric fundus. Coarse calcifications versus high-density ingested material in the distal stomach/duodenum, image 70 series 2. Fluid-filled noninflamed small bowel. No evidence of obstruction. High-riding cecum in the mid abdomen. Normal appendix. Moderate colonic stool burden. No colonic wall thickening or inflammatory change. Vascular/Lymphatic: Aorto bi-iliac atherosclerosis. No aneurysm. Prominent periuterine and adnexal vascularity with dilatation of the left ovarian vein at 6 mm. No  abdominal or pelvic adenopathy. Reproductive: Prominent periuterine and adnexal vascularity. No adnexal mass. Atrophic uterus. Other: No ascites or free air. Musculoskeletal: There are no acute or suspicious osseous abnormalities. IMPRESSION: 1. Abnormal appearance of the chest with progressive findings from 2013 CT. Progressive central bronchial narrowing with areas of architectural distortion. Development of peribronchovascular nodularity throughout both lungs. Overall findings may be due to atypical infection or inflammatory process. Recommend pulmonary consultation. 2. Hypertrophied small mediastinal vessels of uncertain significance. 3. Stable ascending thoracic aortic aneurysm of 4 cm. 4. Wall thickening of the gastric fundus, nonspecific for gastritis versus peptic ulcer disease. Coarse calcification versus high-density ingested material in the duodenum. 5. Prominent periuterine vascularity and dilatation of the ovarian veins, suggesting pelvic congestion syndrome. 6.  Aortic Atherosclerosis (ICD10-I70.0). Electronically Signed   By: Narda RutherfordMelanie  Sanford M.D.   On: 04/14/2018 00:08   Ct Abdomen Pelvis W Contrast  Result Date: 04/14/2018 CLINICAL DATA:  Acute resp illness, > 83 years old; Abd pain, acute, generalized. EXAM: CT CHEST, ABDOMEN, AND PELVIS WITH CONTRAST TECHNIQUE: Multidetector CT imaging of the chest, abdomen and pelvis was performed following the standard protocol during bolus administration of intravenous contrast. CONTRAST:  100mL ISOVUE-300 IOPAMIDOL (ISOVUE-300) INJECTION 61% COMPARISON:  Chest radiograph earlier this day.  Chest CT 12/16/2011 FINDINGS: CT CHEST FINDINGS Cardiovascular: Ectasia of the ascending thoracic aorta at 4 cm, unchanged. Mild aortic atherosclerosis. No dissection. There is an aberrant small branch vessel from the proximal aspect of the descending aorta with serpiginous mediastinal collaterals extending to the subcarinal region and both hila, right greater than left.  Vessel hypertrophy since prior exam, with numerous small perihilar and subcarinal vessels. No filling defects in the central pulmonary arteries to suggest pulmonary embolus. Mild cardiomegaly. There are coronary artery calcifications. No pericardial effusion. The IVC is patent. Mediastinum/Nodes: Multiple small reactive appearing lymph nodes throughout the mediastinum, all subcentimeter. Patulous esophagus as before. No dominant thyroid nodule. Lungs/Pleura: Central bronchial narrowing appears most severe involving the right upper lobe. There is associated bronchial thickening, multifocal septal thickening, ground-glass opacity, and peribronchovascular nodularity. Areas of architectural distortion in the right upper lobe again seen. Volume loss in the right middle lobe. Focal airspace disease in the paramediastinal anterior right middle lobe. Fissural thickening along the minor fissure. Biapical pleuroparenchymal scarring. Small left and trace right pleural effusion. Retained mucus in the upper trachea. Musculoskeletal: Minimal superior endplate compression fracture of T2 and T3, age indeterminate but likely chronic. No suspicious osseous abnormality. CT ABDOMEN PELVIS FINDINGS Hepatobiliary: No  focal liver abnormality is seen. No gallstones, gallbladder wall thickening, or biliary dilatation. Pancreas: Mild pancreatic ductal prominence without peripancreatic inflammation or pancreatic mass. Spleen: Normal in size without focal abnormality. Adrenals/Urinary Tract: Normal adrenal glands. No hydronephrosis or perinephric edema. Homogeneous renal enhancement with symmetric excretion on delayed phase imaging. Urinary bladder is decompressed by Foley catheter. Stomach/Bowel: Bowel evaluation is limited by the absence of enteric contrast and paucity of intra-abdominal fat. Diffuse wall thickening of the gastric fundus. Coarse calcifications versus high-density ingested material in the distal stomach/duodenum, image 70  series 2. Fluid-filled noninflamed small bowel. No evidence of obstruction. High-riding cecum in the mid abdomen. Normal appendix. Moderate colonic stool burden. No colonic wall thickening or inflammatory change. Vascular/Lymphatic: Aorto bi-iliac atherosclerosis. No aneurysm. Prominent periuterine and adnexal vascularity with dilatation of the left ovarian vein at 6 mm. No abdominal or pelvic adenopathy. Reproductive: Prominent periuterine and adnexal vascularity. No adnexal mass. Atrophic uterus. Other: No ascites or free air. Musculoskeletal: There are no acute or suspicious osseous abnormalities. IMPRESSION: 1. Abnormal appearance of the chest with progressive findings from 2013 CT. Progressive central bronchial narrowing with areas of architectural distortion. Development of peribronchovascular nodularity throughout both lungs. Overall findings may be due to atypical infection or inflammatory process. Recommend pulmonary consultation. 2. Hypertrophied small mediastinal vessels of uncertain significance. 3. Stable ascending thoracic aortic aneurysm of 4 cm. 4. Wall thickening of the gastric fundus, nonspecific for gastritis versus peptic ulcer disease. Coarse calcification versus high-density ingested material in the duodenum. 5. Prominent periuterine vascularity and dilatation of the ovarian veins, suggesting pelvic congestion syndrome. 6.  Aortic Atherosclerosis (ICD10-I70.0). Electronically Signed   By: Narda Rutherford M.D.   On: 04/14/2018 00:08   Dg Chest Portable 1 View  Result Date: 04/13/2018 CLINICAL DATA:  Cough, shortness of breath. EXAM: PORTABLE CHEST 1 VIEW COMPARISON:  07/11/2013 FINDINGS: Unchanged upper normal heart size. Atherosclerosis of the aortic arch. Increased peribronchial thickening from prior exam, possible mild Kerley B-lines. Blunting of the costophrenic angles appears similar to prior. Biapical pleuroparenchymal scarring. Peripheral ill-defined opacities in the right upper and  left mid lung zones. No pneumothorax. No acute osseous abnormalities. IMPRESSION: 1. Increased peribronchial thickening from prior exam, bronchitis versus mild pulmonary edema. 2. Ill-defined opacities in the right upper and left mid lung zones, favoring atelectasis or scarring, mild pneumonitis/pneumonia could have a similar appearance. Electronically Signed   By: Narda Rutherford M.D.   On: 04/13/2018 21:59    Procedures Procedures (including critical care time)  Medications Ordered in ED Medications  cefTRIAXone (ROCEPHIN) 1 g in sodium chloride 0.9 % 100 mL IVPB (0 g Intravenous Stopped 04/13/18 2212)  ibuprofen (ADVIL,MOTRIN) 100 MG/5ML suspension (  Canceled Entry 04/13/18 2304)  azithromycin (ZITHROMAX) 500 mg in sodium chloride 0.9 % 250 mL IVPB (has no administration in time range)  sodium chloride 0.9 % bolus 1,000 mL (has no administration in time range)  acetaminophen (TYLENOL) suppository 650 mg ( Rectal Canceled Entry 04/13/18 2304)  sodium chloride 0.9 % bolus 1,000 mL (0 mLs Intravenous Stopped 04/13/18 2213)    And  sodium chloride 0.9 % bolus 500 mL (0 mLs Intravenous Stopped 04/13/18 2332)    And  sodium chloride 0.9 % bolus 250 mL (0 mLs Intravenous Stopped 04/13/18 2332)  ibuprofen (ADVIL,MOTRIN) tablet 600 mg (600 mg Oral Given 04/13/18 2204)  iopamidol (ISOVUE-300) 61 % injection 100 mL (100 mLs Intravenous Contrast Given 04/13/18 2318)     Initial Impression / Assessment and Plan / ED Course  I have reviewed the triage vital signs and the nursing notes.  Pertinent labs & imaging results that were available during my care of the patient were reviewed by me and considered in my medical decision making (see chart for details).    CRITICAL CARE Performed by: Jacalyn Lefevre   Total critical care time: 30 minutes  Critical care time was exclusive of separately billable procedures and treating other patients.  Critical care was necessary to treat or prevent imminent or  life-threatening deterioration.  Critical care was time spent personally by me on the following activities: development of treatment plan with patient and/or surrogate as well as nursing, discussions with consultants, evaluation of patient's response to treatment, examination of patient, obtaining history from patient or surrogate, ordering and performing treatments and interventions, ordering and review of laboratory studies, ordering and review of radiographic studies, pulse oximetry and re-evaluation of patient's condition.   Code sepsis called.  Pt given sepsis fluids and IV rocephin and zithromax were given .  Fever was treated with tylenol and ibuprofen.  HR has improved, but BP is still a little soft.  She was given additional IVFs.  Hospitalists paged and has not called back, so she was signed out to Dr. Read Drivers pending admission.  Final Clinical Impressions(s) / ED Diagnoses   Final diagnoses:  Sepsis without acute organ dysfunction, due to unspecified organism [facility])  Community acquired pneumonia, unspecified laterality    ED Discharge Orders    None       Jacalyn Lefevre, MD 04/14/18 1456

## 2018-04-13 NOTE — ED Notes (Signed)
Family at bedside. 

## 2018-04-13 NOTE — ED Notes (Signed)
Patient transported to CT 

## 2018-04-13 NOTE — ED Triage Notes (Signed)
Pt with cough that started today. Family states pt was sweaty with fever earlier today.

## 2018-04-14 DIAGNOSIS — I10 Essential (primary) hypertension: Secondary | ICD-10-CM | POA: Diagnosis not present

## 2018-04-14 DIAGNOSIS — J209 Acute bronchitis, unspecified: Secondary | ICD-10-CM | POA: Diagnosis present

## 2018-04-14 DIAGNOSIS — J45909 Unspecified asthma, uncomplicated: Secondary | ICD-10-CM | POA: Diagnosis present

## 2018-04-14 DIAGNOSIS — E876 Hypokalemia: Secondary | ICD-10-CM | POA: Diagnosis present

## 2018-04-14 DIAGNOSIS — Z9114 Patient's other noncompliance with medication regimen: Secondary | ICD-10-CM | POA: Diagnosis not present

## 2018-04-14 DIAGNOSIS — Y95 Nosocomial condition: Secondary | ICD-10-CM | POA: Diagnosis present

## 2018-04-14 DIAGNOSIS — A419 Sepsis, unspecified organism: Secondary | ICD-10-CM | POA: Diagnosis not present

## 2018-04-14 DIAGNOSIS — J189 Pneumonia, unspecified organism: Secondary | ICD-10-CM | POA: Diagnosis not present

## 2018-04-14 DIAGNOSIS — Z9119 Patient's noncompliance with other medical treatment and regimen: Secondary | ICD-10-CM | POA: Diagnosis not present

## 2018-04-14 LAB — I-STAT CG4 LACTIC ACID, ED: Lactic Acid, Venous: 0.93 mmol/L (ref 0.5–1.9)

## 2018-04-14 LAB — INFLUENZA PANEL BY PCR (TYPE A & B)
Influenza A By PCR: NEGATIVE
Influenza B By PCR: NEGATIVE

## 2018-04-14 MED ORDER — SODIUM CHLORIDE 0.9 % IV BOLUS
1000.0000 mL | Freq: Once | INTRAVENOUS | Status: AC
  Administered 2018-04-14: 1000 mL via INTRAVENOUS

## 2018-04-14 MED ORDER — SODIUM CHLORIDE 0.9 % IV SOLN
1.0000 g | INTRAVENOUS | Status: DC
  Administered 2018-04-15: 1 g via INTRAVENOUS
  Filled 2018-04-14 (×2): qty 10

## 2018-04-14 MED ORDER — SODIUM CHLORIDE 0.9 % IV SOLN
500.0000 mg | INTRAVENOUS | Status: DC
  Filled 2018-04-14: qty 500

## 2018-04-14 MED ORDER — SODIUM CHLORIDE 0.9 % IV SOLN
500.0000 mg | Freq: Once | INTRAVENOUS | Status: AC
  Administered 2018-04-14: 500 mg via INTRAVENOUS
  Filled 2018-04-14: qty 500

## 2018-04-14 MED ORDER — ZOLPIDEM TARTRATE 5 MG PO TABS
5.0000 mg | ORAL_TABLET | Freq: Every evening | ORAL | Status: DC | PRN
  Filled 2018-04-14: qty 1

## 2018-04-14 MED ORDER — SODIUM CHLORIDE 0.9 % IV SOLN
1.0000 g | INTRAVENOUS | Status: DC
  Administered 2018-04-14: 1 g via INTRAVENOUS

## 2018-04-14 MED ORDER — SODIUM CHLORIDE 0.9 % IV SOLN
INTRAVENOUS | Status: DC
  Administered 2018-04-14: 17:00:00 via INTRAVENOUS

## 2018-04-14 MED ORDER — ALBUTEROL SULFATE (2.5 MG/3ML) 0.083% IN NEBU: 2.5000 mg | INHALATION_SOLUTION | Freq: Four times a day (QID) | RESPIRATORY_TRACT | Status: DC | PRN

## 2018-04-14 MED ORDER — DM-GUAIFENESIN ER 30-600 MG PO TB12
1.0000 | ORAL_TABLET | Freq: Two times a day (BID) | ORAL | Status: DC
  Administered 2018-04-14 – 2018-04-16 (×4): 1 via ORAL
  Filled 2018-04-14 (×5): qty 1

## 2018-04-14 MED ORDER — LEVALBUTEROL TARTRATE 45 MCG/ACT IN AERO
2.0000 | INHALATION_SPRAY | Freq: Four times a day (QID) | RESPIRATORY_TRACT | Status: DC | PRN
  Filled 2018-04-14: qty 15

## 2018-04-14 MED ORDER — HYDRALAZINE HCL 20 MG/ML IJ SOLN: 5.0000 mg | INTRAMUSCULAR | Status: DC | PRN

## 2018-04-14 MED ORDER — SODIUM CHLORIDE 0.9 % IV SOLN
500.0000 mg | INTRAVENOUS | Status: DC
  Administered 2018-04-15 – 2018-04-16 (×2): 500 mg via INTRAVENOUS
  Filled 2018-04-14 (×3): qty 500

## 2018-04-14 MED ORDER — ACETAMINOPHEN 650 MG RE SUPP: 650.0000 mg | Freq: Four times a day (QID) | RECTAL | Status: DC | PRN

## 2018-04-14 MED ORDER — ONDANSETRON HCL 4 MG PO TABS: 4.0000 mg | ORAL_TABLET | Freq: Four times a day (QID) | ORAL | Status: DC | PRN

## 2018-04-14 MED ORDER — ACETAMINOPHEN 325 MG PO TABS: 650.0000 mg | ORAL_TABLET | Freq: Four times a day (QID) | ORAL | Status: DC | PRN

## 2018-04-14 MED ORDER — ONDANSETRON HCL 4 MG/2ML IJ SOLN: 4.0000 mg | Freq: Three times a day (TID) | INTRAMUSCULAR | Status: DC | PRN

## 2018-04-14 MED ORDER — AZITHROMYCIN 500 MG IV SOLR
INTRAVENOUS | Status: AC
  Filled 2018-04-14: qty 500

## 2018-04-14 MED ORDER — PHENOL 1.4 % MT LIQD
1.0000 | OROMUCOSAL | Status: DC | PRN
  Administered 2018-04-14: 1 via OROMUCOSAL
  Filled 2018-04-14: qty 177

## 2018-04-14 MED ORDER — SODIUM CHLORIDE 0.9 % IV SOLN
INTRAVENOUS | Status: DC
  Administered 2018-04-14: 02:00:00 via INTRAVENOUS

## 2018-04-14 MED ORDER — ENOXAPARIN SODIUM 40 MG/0.4ML ~~LOC~~ SOLN
40.0000 mg | SUBCUTANEOUS | Status: DC
  Administered 2018-04-15: 40 mg via SUBCUTANEOUS
  Filled 2018-04-14 (×2): qty 0.4

## 2018-04-14 MED ORDER — ONDANSETRON HCL 4 MG/2ML IJ SOLN: 4.0000 mg | Freq: Four times a day (QID) | INTRAMUSCULAR | Status: DC | PRN

## 2018-04-14 MED ORDER — ALBUTEROL SULFATE (2.5 MG/3ML) 0.083% IN NEBU: 2.5000 mg | INHALATION_SOLUTION | RESPIRATORY_TRACT | Status: DC | PRN

## 2018-04-14 MED ORDER — ALBUTEROL SULFATE (2.5 MG/3ML) 0.083% IN NEBU
2.5000 mg | INHALATION_SOLUTION | Freq: Four times a day (QID) | RESPIRATORY_TRACT | Status: DC
  Administered 2018-04-14: 2.5 mg via RESPIRATORY_TRACT
  Filled 2018-04-14: qty 3

## 2018-04-14 NOTE — Care Management (Signed)
This is a no charge note  Transfer from [facility] per Dr. Read Drivers  85 year old lady with a past medical history of hypertension, medication noncompliance, who presents with fever, chills, cough.  CT of chest showed central bronchial narrowing, possible acute bronchitis versus early stage pneumonia.  Patient has sepsis with leukocytosis, tachycardia, fever.  Hemodynamically stable.  Patient is placed on MedSurg bed for observation.  IV Rocephin and azithromycin were started in ED.  CT of chest and abdomen/pelvis: 1. Abnormal appearance of the chest with progressive findings from 2013 CT. Progressive central bronchial narrowing with areas of architectural distortion. Development of peribronchovascular nodularity throughout both lungs. Overall findings may be due to atypical infection or inflammatory process. Recommend pulmonary consultation. 2. Hypertrophied small mediastinal vessels of uncertain significance. 3. Stable ascending thoracic aortic aneurysm of 4 cm. 4. Wall thickening of the gastric fundus, nonspecific for gastritis versus peptic ulcer disease. Coarse calcification versus high-density ingested material in the duodenum. 5. Prominent periuterine vascularity and dilatation of the ovarian veins, suggesting pelvic congestion syndrome    Please call manager of [facility] at [phone] when pt arrives to floor   Lorretta Harp, MD  [facility] Pager [phone]  If 7PM-7AM, please contact night-coverage [email or web address] Password [facility] 04/14/2018, 12:59 AM

## 2018-04-14 NOTE — H&P (Signed)
[facility]                                                                                    Patient Demographics  Tracy Kerr, is a 85 y.o. female  CSN: [id number]  MRN: [id number]  DOB - 09/19/1933  Admit Date - 04/13/2018  Outpatient Primary MD for the patient is System, Pcp Not In   With History of -  Past Medical History:  Diagnosis Date  . Hypertension   . Noncompliance with medications       No past surgical history on file.  in for   Chief Complaint  Patient presents with  . Cough     HPI  Tracy Kerr  is a 85 y.o. female, with past medical history significant for hypertension presenting with 2 days history of sore throat, cough decreased appetite, mild shortness of breath, fever and chills.  Patient denies any nausea vomiting.  Patient does not speak English and her family was at bedside helping with translation.  White blood cell count 14.9 yesterday.  Lactic acid was 2.15 and down to 0.93 today    Review of Systems    In addition to the HPI above,  Fever-chills, No Headache, No changes with Vision or hearing, No problems swallowing food or Liquids, No Chest pain,  No Abdominal pain, No Nausea or Vommitting, Bowel movements are regular, No Blood in stool or Urine, No dysuria, No new skin rashes or bruises, No new joints pains-aches,  No new weakness, tingling, numbness in any extremity, No recent weight gain or loss, No polyuria, polydypsia or polyphagia, No significant Mental Stressors.  A full 10 point Review of Systems was done, except as stated above, all other Review of Systems were negative.   Social History Social History   Tobacco Use  . Smoking status: Never Smoker  Substance Use Topics  . Alcohol use: No     Family History None according to family who was at bedside  Prior to Admission medications   Medication Sig Start Date End Date Taking? Authorizing Provider  acetaminophen (TYLENOL) 500 MG tablet Take 500 mg by  mouth every 6 (six) hours as needed for mild pain or fever.   Yes [provider]    No Known Allergies  Physical Exam  Vitals  Blood pressure 128/72, pulse 94, temperature 98.1 F (36.7 C), resp. rate 18, height 5\' 2"  (1.575 m), weight 54.4 kg, SpO2 99 %.   1. General elderly female, looks tired  2. Normal affect and insight, Not Suicidal or Homicidal, Awake Alert, Oriented X 3 (family at bedside).  3. No F.N deficits, grossly, patient moving all extremities.  4. Ears and Eyes appear Normal, Conjunctivae clear, PERRLA. Moist Oral Mucosa.  5. Supple Neck, No JVD, No cervical lymphadenopathy appriciated, No Carotid Bruits.  6. Symmetrical Chest wall movement, Good air movement bilaterally, CTAB.  7. RRR, No Gallops, Rubs or Murmurs, No Parasternal Heave.  8. Positive Bowel Sounds, Abdomen Soft, Non tender, No organomegaly appriciated,No rebound -guarding or rigidity.  9.  No Cyanosis, Normal Skin Turgor, No Skin Rash or Bruise.  10. Good muscle tone,  joints appear normal ,  no effusions, Normal ROM.    Data Review  CBC Recent Labs  Lab 04/13/18 2137  WBC 14.9*  HGB 13.7  HCT 47.1*  PLT 224  MCV 75.4*  MCH 21.9*  MCHC 29.1*  RDW 18.1*  LYMPHSABS 1.3  MONOABS 0.8  EOSABS 0.0  BASOSABS 0.0   ------------------------------------------------------------------------------------------------------------------  Chemistries  Recent Labs  Lab 04/13/18 2137  NA 131*  K 4.0  CL 92*  CO2 26  GLUCOSE 86  BUN 15  CREATININE 0.66  CALCIUM 9.2  AST 47*  ALT 31  ALKPHOS 125  BILITOT 1.7*   ------------------------------------------------------------------------------------------------------------------ estimated creatinine clearance is 42.9 mL/min (by C-G formula based on SCr of 0.66 mg/dL). ------------------------------------------------------------------------------------------------------------------ No results for input(s): TSH, T4TOTAL, T3FREE,  THYROIDAB in the last 72 hours.  Invalid input(s): FREET3   Coagulation profile No results for input(s): INR, PROTIME in the last 168 hours. ------------------------------------------------------------------------------------------------------------------- No results for input(s): DDIMER in the last 72 hours. -------------------------------------------------------------------------------------------------------------------  Cardiac Enzymes No results for input(s): CKMB, TROPONINI, MYOGLOBIN in the last 168 hours.  Invalid input(s): CK ------------------------------------------------------------------------------------------------------------------ Invalid input(s): POCBNP   ---------------------------------------------------------------------------------------------------------------  Urinalysis    Component Value Date/Time   COLORURINE YELLOW 04/13/2018 2151   APPEARANCEUR HAZY (A) 04/13/2018 2151   LABSPEC 1.025 04/13/2018 2151   PHURINE 6.0 04/13/2018 2151   GLUCOSEU NEGATIVE 04/13/2018 2151   HGBUR LARGE (A) 04/13/2018 2151   BILIRUBINUR SMALL (A) 04/13/2018 2151   KETONESUR 40 (A) 04/13/2018 2151   PROTEINUR 100 (A) 04/13/2018 2151   NITRITE NEGATIVE 04/13/2018 2151   LEUKOCYTESUR NEGATIVE 04/13/2018 2151    ----------------------------------------------------------------------------------------------------------------   Imaging results:   Ct Chest W Contrast  Result Date: 04/14/2018 CLINICAL DATA:  Acute resp illness, > 83 years old; Abd pain, acute, generalized. EXAM: CT CHEST, ABDOMEN, AND PELVIS WITH CONTRAST TECHNIQUE: Multidetector CT imaging of the chest, abdomen and pelvis was performed following the standard protocol during bolus administration of intravenous contrast. CONTRAST:  100mL ISOVUE-300 IOPAMIDOL (ISOVUE-300) INJECTION 61% COMPARISON:  Chest radiograph earlier this day.  Chest CT 12/16/2011 FINDINGS: CT CHEST FINDINGS Cardiovascular: Ectasia of the  ascending thoracic aorta at 4 cm, unchanged. Mild aortic atherosclerosis. No dissection. There is an aberrant small branch vessel from the proximal aspect of the descending aorta with serpiginous mediastinal collaterals extending to the subcarinal region and both hila, right greater than left. Vessel hypertrophy since prior exam, with numerous small perihilar and subcarinal vessels. No filling defects in the central pulmonary arteries to suggest pulmonary embolus. Mild cardiomegaly. There are coronary artery calcifications. No pericardial effusion. The IVC is patent. Mediastinum/Nodes: Multiple small reactive appearing lymph nodes throughout the mediastinum, all subcentimeter. Patulous esophagus as before. No dominant thyroid nodule. Lungs/Pleura: Central bronchial narrowing appears most severe involving the right upper lobe. There is associated bronchial thickening, multifocal septal thickening, ground-glass opacity, and peribronchovascular nodularity. Areas of architectural distortion in the right upper lobe again seen. Volume loss in the right middle lobe. Focal airspace disease in the paramediastinal anterior right middle lobe. Fissural thickening along the minor fissure. Biapical pleuroparenchymal scarring. Small left and trace right pleural effusion. Retained mucus in the upper trachea. Musculoskeletal: Minimal superior endplate compression fracture of T2 and T3, age indeterminate but likely chronic. No suspicious osseous abnormality. CT ABDOMEN PELVIS FINDINGS Hepatobiliary: No focal liver abnormality is seen. No gallstones, gallbladder wall thickening, or biliary dilatation. Pancreas: Mild pancreatic ductal prominence without peripancreatic inflammation or pancreatic mass. Spleen: Normal in size without focal abnormality. Adrenals/Urinary Tract: Normal adrenal glands. No hydronephrosis or  perinephric edema. Homogeneous renal enhancement with symmetric excretion on delayed phase imaging. Urinary bladder is  decompressed by Foley catheter. Stomach/Bowel: Bowel evaluation is limited by the absence of enteric contrast and paucity of intra-abdominal fat. Diffuse wall thickening of the gastric fundus. Coarse calcifications versus high-density ingested material in the distal stomach/duodenum, image 70 series 2. Fluid-filled noninflamed small bowel. No evidence of obstruction. High-riding cecum in the mid abdomen. Normal appendix. Moderate colonic stool burden. No colonic wall thickening or inflammatory change. Vascular/Lymphatic: Aorto bi-iliac atherosclerosis. No aneurysm. Prominent periuterine and adnexal vascularity with dilatation of the left ovarian vein at 6 mm. No abdominal or pelvic adenopathy. Reproductive: Prominent periuterine and adnexal vascularity. No adnexal mass. Atrophic uterus. Other: No ascites or free air. Musculoskeletal: There are no acute or suspicious osseous abnormalities. IMPRESSION: 1. Abnormal appearance of the chest with progressive findings from 2013 CT. Progressive central bronchial narrowing with areas of architectural distortion. Development of peribronchovascular nodularity throughout both lungs. Overall findings may be due to atypical infection or inflammatory process. Recommend pulmonary consultation. 2. Hypertrophied small mediastinal vessels of uncertain significance. 3. Stable ascending thoracic aortic aneurysm of 4 cm. 4. Wall thickening of the gastric fundus, nonspecific for gastritis versus peptic ulcer disease. Coarse calcification versus high-density ingested material in the duodenum. 5. Prominent periuterine vascularity and dilatation of the ovarian veins, suggesting pelvic congestion syndrome. 6.  Aortic Atherosclerosis (ICD10-I70.0). Electronically Signed   By: Narda Rutherford M.D.   On: 04/14/2018 00:08   Ct Abdomen Pelvis W Contrast  Result Date: 04/14/2018 CLINICAL DATA:  Acute resp illness, > 24 years old; Abd pain, acute, generalized. EXAM: CT CHEST, ABDOMEN, AND PELVIS  WITH CONTRAST TECHNIQUE: Multidetector CT imaging of the chest, abdomen and pelvis was performed following the standard protocol during bolus administration of intravenous contrast. CONTRAST:  ISOVUE-300 IOPAMIDOL (ISOVUE-300) INJECTION 61% COMPARISON:  Chest radiograph earlier this day.  Chest CT 12/16/2011 FINDINGS: CT CHEST FINDINGS Cardiovascular: Ectasia of the ascending thoracic aorta at 4 cm, unchanged. Mild aortic atherosclerosis. No dissection. There is an aberrant small branch vessel from the proximal aspect of the descending aorta with serpiginous mediastinal collaterals extending to the subcarinal region and both hila, right greater than left. Vessel hypertrophy since prior exam, with numerous small perihilar and subcarinal vessels. No filling defects in the central pulmonary arteries to suggest pulmonary embolus. Mild cardiomegaly. There are coronary artery calcifications. No pericardial effusion. The IVC is patent. Mediastinum/Nodes: Multiple small reactive appearing lymph nodes throughout the mediastinum, all subcentimeter. Patulous esophagus as before. No dominant thyroid nodule. Lungs/Pleura: Central bronchial narrowing appears most severe involving the right upper lobe. There is associated bronchial thickening, multifocal septal thickening, ground-glass opacity, and peribronchovascular nodularity. Areas of architectural distortion in the right upper lobe again seen. Volume loss in the right middle lobe. Focal airspace disease in the paramediastinal anterior right middle lobe. Fissural thickening along the minor fissure. Biapical pleuroparenchymal scarring. Small left and trace right pleural effusion. Retained mucus in the upper trachea. Musculoskeletal: Minimal superior endplate compression fracture of T2 and T3, age indeterminate but likely chronic. No suspicious osseous abnormality. CT ABDOMEN PELVIS FINDINGS Hepatobiliary: No focal liver abnormality is seen. No gallstones, gallbladder wall  thickening, or biliary dilatation. Pancreas: Mild pancreatic ductal prominence without peripancreatic inflammation or pancreatic mass. Spleen: Normal in size without focal abnormality. Adrenals/Urinary Tract: Normal adrenal glands. No hydronephrosis or perinephric edema. Homogeneous renal enhancement with symmetric excretion on delayed phase imaging. Urinary bladder is decompressed by Foley catheter. Stomach/Bowel: Bowel evaluation is limited  by the absence of enteric contrast and paucity of intra-abdominal fat. Diffuse wall thickening of the gastric fundus. Coarse calcifications versus high-density ingested material in the distal stomach/duodenum, image 70 series 2. Fluid-filled noninflamed small bowel. No evidence of obstruction. High-riding cecum in the mid abdomen. Normal appendix. Moderate colonic stool burden. No colonic wall thickening or inflammatory change. Vascular/Lymphatic: Aorto bi-iliac atherosclerosis. No aneurysm. Prominent periuterine and adnexal vascularity with dilatation of the left ovarian vein at 6 mm. No abdominal or pelvic adenopathy. Reproductive: Prominent periuterine and adnexal vascularity. No adnexal mass. Atrophic uterus. Other: No ascites or free air. Musculoskeletal: There are no acute or suspicious osseous abnormalities. IMPRESSION: 1. Abnormal appearance of the chest with progressive findings from 2013 CT. Progressive central bronchial narrowing with areas of architectural distortion. Development of peribronchovascular nodularity throughout both lungs. Overall findings may be due to atypical infection or inflammatory process. Recommend pulmonary consultation. 2. Hypertrophied small mediastinal vessels of uncertain significance. 3. Stable ascending thoracic aortic aneurysm of 4 cm. 4. Wall thickening of the gastric fundus, nonspecific for gastritis versus peptic ulcer disease. Coarse calcification versus high-density ingested material in the duodenum. 5. Prominent periuterine  vascularity and dilatation of the ovarian veins, suggesting pelvic congestion syndrome. 6.  Aortic Atherosclerosis (ICD10-I70.0). Electronically Signed   By: Narda Rutherford M.D.   On: 04/14/2018 00:08   Dg Chest Portable 1 View  Result Date: 04/13/2018 CLINICAL DATA:  Cough, shortness of breath. EXAM: PORTABLE CHEST 1 VIEW COMPARISON:  07/11/2013 FINDINGS: Unchanged upper normal heart size. Atherosclerosis of the aortic arch. Increased peribronchial thickening from prior exam, possible mild Kerley B-lines. Blunting of the costophrenic angles appears similar to prior. Biapical pleuroparenchymal scarring. Peripheral ill-defined opacities in the right upper and left mid lung zones. No pneumothorax. No acute osseous abnormalities. IMPRESSION: 1. Increased peribronchial thickening from prior exam, bronchitis versus mild pulmonary edema. 2. Ill-defined opacities in the right upper and left mid lung zones, favoring atelectasis or scarring, mild pneumonitis/pneumonia could have a similar appearance. Electronically Signed   By: Narda Rutherford M.D.   On: 04/13/2018 21:59     Assessment & Plan  HCAP with sepsis Decrease IV fluids Continue with IV Zithromax and Rocephin Check cultures and antigens  History of hypertension Noncompliance  Reactive airway disease Continue with nebulizer treatment  DVT Prophylaxis Lovenox  AM Labs Ordered, also please review Full Orders  Family Communication: Admission, patients condition and plan of care including tests being ordered have been discussed with the patient and family at bedside who indicate understanding and agree with the plan and Code Status.  Code Status full  Disposition Plan: Home  Time spent in minutes : 39 minutes  Condition GUARDED   @SIGNATURE @

## 2018-04-14 NOTE — Progress Notes (Signed)
D/c foley catheter, no indication for foley and no order written for foley. Pt tolerated well. Will continue to monitor pt. Nelda Marseille, RN

## 2018-04-14 NOTE — Progress Notes (Signed)
Tracy Kerr is a 85 y.o. female patient admitted from ED awake, alert - oriented  X 4 - no acute distress noted.  VSS - Blood pressure 128/72, pulse 94, temperature 98.1 F (36.7 C), resp. rate 18, height 5\' 2"  (1.575 m), weight 54.4 kg, SpO2 99 %.    IV in place, occlusive dsg intact without redness.  Pt and family are orientated to room and floor. Admission armband ID verified with patient/family, and in place.   Fall assessment complete, with patient and family able to verbalize understanding of risk associated with falls, and verbalized understanding to call nsg before up out of bed. Call light within reach, patient able to voice, and demonstrate understanding. Video interpreter available when needed. Skin, clean-dry- intact without evidence of bruising, or skin tears.   No evidence of skin break down noted on exam.     Will cont to eval and treat per MD orders.  Campbell Riches, RN 04/14/2018 4:56 PM

## 2018-04-15 LAB — BASIC METABOLIC PANEL
Anion gap: 5 (ref 5–15)
BUN: 6 mg/dL — ABNORMAL LOW (ref 8–23)
CO2: 29 mmol/L (ref 22–32)
Calcium: 7.9 mg/dL — ABNORMAL LOW (ref 8.9–10.3)
Chloride: 106 mmol/L (ref 98–111)
Creatinine, Ser: 0.6 mg/dL (ref 0.44–1.00)
GFR calc Af Amer: >60 mL/min (ref >60)
GFR calc non Af Amer: >60 mL/min (ref >60)
Glucose, Bld: 102 mg/dL — ABNORMAL HIGH (ref 70–99)
Potassium: 3.1 mmol/L — ABNORMAL LOW (ref 3.5–5.1)
Sodium: 140 mmol/L (ref 135–145)

## 2018-04-15 LAB — RESPIRATORY PANEL BY PCR
Adenovirus: NOT DETECTED
Bordetella pertussis: NOT DETECTED
Chlamydophila pneumoniae: NOT DETECTED
Coronavirus 229E: NOT DETECTED
Coronavirus HKU1: NOT DETECTED
Coronavirus NL63: NOT DETECTED
Coronavirus OC43: NOT DETECTED
Influenza A: NOT DETECTED
Influenza B: NOT DETECTED
Metapneumovirus: NOT DETECTED
Mycoplasma pneumoniae: NOT DETECTED
PARAINFLUENZA VIRUS 1-RVPPCR: NOT DETECTED
Parainfluenza Virus 2: NOT DETECTED
Parainfluenza Virus 3: NOT DETECTED
Parainfluenza Virus 4: NOT DETECTED
RHINOVIRUS / ENTEROVIRUS - RVPPCR: NOT DETECTED
Respiratory Syncytial Virus: NOT DETECTED

## 2018-04-15 LAB — URINE CULTURE: Culture: NO GROWTH

## 2018-04-15 LAB — CBC
HEMATOCRIT: 37.6 % (ref 36.0–46.0)
HEMOGLOBIN: 11 g/dL — AB (ref 12.0–15.0)
MCH: 22.5 pg — ABNORMAL LOW (ref 26.0–34.0)
MCHC: 29.3 g/dL — ABNORMAL LOW (ref 30.0–36.0)
MCV: 77 fL — ABNORMAL LOW (ref 80.0–100.0)
Platelets: 189 10*3/uL (ref 150–400)
RBC: 4.88 MIL/uL (ref 3.87–5.11)
RDW: 17.1 % — ABNORMAL HIGH (ref 11.5–15.5)
WBC: 9.2 10*3/uL (ref 4.0–10.5)
nRBC: 0 % (ref 0.0–0.2)

## 2018-04-15 MED ORDER — AMLODIPINE BESYLATE 5 MG PO TABS
5.0000 mg | ORAL_TABLET | Freq: Every day | ORAL | Status: DC
  Administered 2018-04-15 – 2018-04-16 (×2): 5 mg via ORAL
  Filled 2018-04-15 (×2): qty 1

## 2018-04-15 MED ORDER — POTASSIUM CHLORIDE CRYS ER 20 MEQ PO TBCR
40.0000 meq | EXTENDED_RELEASE_TABLET | Freq: Once | ORAL | Status: AC
  Administered 2018-04-15: 40 meq via ORAL
  Filled 2018-04-15: qty 2

## 2018-04-15 NOTE — Progress Notes (Signed)
PROGRESS NOTE        PATIENT DETAILS Name: Tracy Kerr Age: 85 y.o. Sex: female Date of Birth: March 17, 1934 Admit Date: 04/13/2018 Admitting Physician Lorretta Harp, MD [id number], Pcp Not In  Brief Narrative: Patient is a 85 y.o. female 3 of hypertension-noncompliant with medications-does not follow with PCP (by choice) presenting with 2-3-day history of fever, cough-thought to have pneumonia-and admitted to the hospitalist service.  Subjective: Daughter-in-law at bedside-she translates-apparently patient is adamant in going home-after much discussion-patient agreed to stay 1 more night.  She continues to cough-but her breathing is overall better.  Assessment/Plan: Sepsis secondary to pneumonia: Sepsis pathophysiology is improving-patient reports clinical improvement-less cough and shortness of breath.  Respiratory virus panel negative, blood cultures negative so far.  Continue Rocephin and Zithromax.  Hypertension: Uncontrolled-start amlodipine  Hypokalemia: Replete and recheck  Abnormal CT appearance of the chest: Appears to have progressive central bronchial narrowing and architectural distortion-we will talk with family to see if patient can follow with outpatient MD for repeat CT chest following a course of antimicrobial therapy.    DVT Prophylaxis: Prophylactic Lovenox   Code Status: Full code   Family Communication: None at bedside  Disposition Plan: Remain inpatient  Antimicrobial agents: Anti-infectives (From admission, onward)   Start     Dose/Rate Route Frequency Ordered Stop   04/15/18 0100  azithromycin (ZITHROMAX) 500 mg in sodium chloride 0.9 % 250 mL IVPB  Status:  Discontinued     500 mg 250 mL/hr over 60 Minutes Intravenous Every 24 hours 04/14/18 1655 04/14/18 1703   04/15/18 0100  azithromycin (ZITHROMAX) 500 mg in sodium chloride 0.9 % 250 mL IVPB     500 mg 250 mL/hr over 60 Minutes Intravenous Every 24 hours 04/14/18 1703 04/21/18  0059   04/14/18 2200  cefTRIAXone (ROCEPHIN) 1 g in sodium chloride 0.9 % 100 mL IVPB     1 g 200 mL/hr over 30 Minutes Intravenous Every 24 hours 04/14/18 1702 04/20/18 2159   04/14/18 1700  cefTRIAXone (ROCEPHIN) 1 g in sodium chloride 0.9 % 100 mL IVPB  Status:  Discontinued     1 g 200 mL/hr over 30 Minutes Intravenous Every 24 hours 04/14/18 1655 04/14/18 1701   04/14/18 0028  azithromycin (ZITHROMAX) 500 MG injection    Note to Pharmacy:  Phillips Grout   : cabinet override      04/14/18 0028 04/14/18 0719   04/14/18 0015  azithromycin (ZITHROMAX) 500 mg in sodium chloride 0.9 % 250 mL IVPB     500 mg 250 mL/hr over 60 Minutes Intravenous  Once 04/14/18 0012 04/14/18 0139   04/13/18 2200  cefTRIAXone (ROCEPHIN) 1 g in sodium chloride 0.9 % 100 mL IVPB  Status:  Discontinued     1 g 200 mL/hr over 30 Minutes Intravenous Every 24 hours 04/13/18 2145 04/14/18 1702      Procedures: None  CONSULTS:  None  Time spent: 25 minutes-Greater than 50% of this time was spent in counseling, explanation of diagnosis, planning of further management, and coordination of care.  MEDICATIONS: Scheduled Meds: . dextromethorphan-guaiFENesin  1 tablet Oral BID  . enoxaparin (LOVENOX) injection  40 mg Subcutaneous Q24H   Continuous Infusions: . sodium chloride 50 mL/hr at 04/14/18 1724  . azithromycin 500 mg (04/15/18 0036)  . cefTRIAXone (ROCEPHIN)  IV     PRN Meds:.acetaminophen, acetaminophen **OR**  acetaminophen, albuterol, albuterol, hydrALAZINE, ondansetron (ZOFRAN) IV, ondansetron **OR** ondansetron (ZOFRAN) IV, phenol, zolpidem   PHYSICAL EXAM: Vital signs: Vitals:   04/14/18 2033 04/14/18 2158 04/15/18 0534 04/15/18 1348  BP:  (!) 145/93 (!) 146/86 (!) 171/76  Pulse:  (!) 104 96 (!) 103  Resp:  18 18 18   Temp:  98.9 F (37.2 C) 98.8 F (37.1 C) 98.7 F (37.1 C)  TempSrc:    Oral  SpO2: 97% 98% 96% 94%  Weight:      Height:       Filed Weights   04/13/18 2124    Weight: 54.4 kg   Body mass index is 21.95 kg/m.   General appearance :Awake, alert, not in any distress.  HEENT: Atraumatic and Normocephalic Neck: supple, no JVD. No cervical lymphadenopathy. No thyromegaly Resp:Good air entry bilaterally, few scattered rhonchi CVS: S1 S2 regular.  GI: Bowel sounds present, Non tender and not distended with no gaurding, rigidity or rebound.No organomegaly Extremities: B/L Lower Ext shows no edema, both legs are warm to touch Neurology:  speech clear,Non focal, sensation is grossly intact. Musculoskeletal:No digital cyanosis Skin:No Rash, warm and dry Wounds:N/A  I have personally reviewed following labs and imaging studies  LABORATORY DATA: CBC: Recent Labs  Lab 04/13/18 2137 04/15/18 0650  WBC 14.9* 9.2  NEUTROABS 12.7*  --   HGB 13.7 11.0*  HCT 47.1* 37.6  MCV 75.4* 77.0*  PLT 224 189    Basic Metabolic Panel: Recent Labs  Lab 04/13/18 2137 04/15/18 0650  NA 131* 140  K 4.0 3.1*  CL 92* 106  CO2 26 29  GLUCOSE 86 102*  BUN 15 6*  CREATININE 0.66 0.60  CALCIUM 9.2 7.9*    GFR: Estimated Creatinine Clearance: 42.9 mL/min (by C-G formula based on SCr of 0.6 mg/dL).  Liver Function Tests: Recent Labs  Lab 04/13/18 2137  AST 47*  ALT 31  ALKPHOS 125  BILITOT 1.7*  PROT 8.4*  ALBUMIN 3.2*   No results for input(s): LIPASE, AMYLASE in the last 168 hours. No results for input(s): AMMONIA in the last 168 hours.  Coagulation Profile: No results for input(s): INR, PROTIME in the last 168 hours.  Cardiac Enzymes: No results for input(s): CKTOTAL, CKMB, CKMBINDEX, TROPONINI in the last 168 hours.  BNP (last 3 results) No results for input(s): PROBNP in the last 8760 hours.  HbA1C: No results for input(s): HGBA1C in the last 72 hours.  CBG: No results for input(s): GLUCAP in the last 168 hours.  Lipid Profile: No results for input(s): CHOL, HDL, LDLCALC, TRIG, CHOLHDL, LDLDIRECT in the last 72 hours.  Thyroid  Function Tests: No results for input(s): TSH, T4TOTAL, FREET4, T3FREE, THYROIDAB in the last 72 hours.  Anemia Panel: No results for input(s): VITAMINB12, FOLATE, FERRITIN, TIBC, IRON, RETICCTPCT in the last 72 hours.  Urine analysis:    Component Value Date/Time   COLORURINE YELLOW 04/13/2018 2151   APPEARANCEUR HAZY (A) 04/13/2018 2151   LABSPEC 1.025 04/13/2018 2151   PHURINE 6.0 04/13/2018 2151   GLUCOSEU NEGATIVE 04/13/2018 2151   HGBUR LARGE (A) 04/13/2018 2151   BILIRUBINUR SMALL (A) 04/13/2018 2151   KETONESUR 40 (A) 04/13/2018 2151   PROTEINUR 100 (A) 04/13/2018 2151   NITRITE NEGATIVE 04/13/2018 2151   LEUKOCYTESUR NEGATIVE 04/13/2018 2151    Sepsis Labs: Lactic Acid, Venous    Component Value Date/Time   LATICACIDVEN 0.93 04/14/2018 0746    MICROBIOLOGY: Recent Results (from the past 240 hour(s))  Culture, blood (Routine  x 2)     Status: None (Preliminary result)   Collection Time: 04/13/18  9:40 PM  Result Value Ref Range Status   Specimen Description   Final    BLOOD RIGHT ANTECUBITAL Performed at [facility], [address] [place], [place] [address]    Special Requests   Final    BOTTLES DRAWN AEROBIC AND ANAEROBIC Blood Culture adequate volume Performed at [facility], [address] [place], [place] [address]    Culture   Final    NO GROWTH 1 DAY Performed at [facility], [address] [place], [place] [address]    Report Status PENDING  Incomplete  Urine culture     Status: None   Collection Time: 04/13/18  9:51 PM  Result Value Ref Range Status   Specimen Description   Final    URINE, CATHETERIZED Performed at [facility], [address] [place], [place] [address]    Special Requests   Final    NONE Performed at [facility], [address] [place], [place] [address]    Culture   Final    NO GROWTH Performed at [facility], [address], [place], [place] [address]     Report Status 04/15/2018 FINAL  Final  Culture, blood (Routine x 2)     Status: None (Preliminary result)   Collection Time: 04/13/18 10:00 PM  Result Value Ref Range Status   Specimen Description   Final    BLOOD LEFT ANTECUBITAL Performed at [facility], [address] [place], [place] [address]    Special Requests   Final    BOTTLES DRAWN AEROBIC AND ANAEROBIC Blood Culture adequate volume Performed at [facility], [address] [place], [place] [address]    Culture   Final    NO GROWTH 1 DAY Performed at [facility], [address] [place], [place] [address]    Report Status PENDING  Incomplete  Culture, blood (routine x 2) Call MD if unable to obtain prior to antibiotics being given     Status: None (Preliminary result)   Collection Time: 04/14/18  7:36 PM  Result Value Ref Range Status   Specimen Description BLOOD RIGHT HAND  Final   Special Requests   Final    BOTTLES DRAWN AEROBIC ONLY Blood Culture adequate volume   Culture   Final    NO GROWTH < 24 HOURS Performed at [facility], [address] [place], [place] [address]    Report Status PENDING  Incomplete  Culture, blood (routine x 2) Call MD if unable to obtain prior to antibiotics being given     Status: None (Preliminary result)   Collection Time: 04/14/18  7:46 PM  Result Value Ref Range Status   Specimen Description BLOOD LEFT ANTECUBITAL  Final   Special Requests   Final    BOTTLES DRAWN AEROBIC ONLY Blood Culture adequate volume   Culture   Final    NO GROWTH < 24 HOURS Performed at [facility], [address], [place], [place] [address]    Report Status PENDING  Incomplete  Respiratory Panel by PCR     Status: None   Collection Time: 04/15/18  7:19 AM  Result Value Ref Range Status   Adenovirus NOT DETECTED NOT DETECTED Final   Coronavirus 229E NOT DETECTED NOT DETECTED Final   Coronavirus  HKU1 NOT DETECTED NOT DETECTED Final   Coronavirus NL63 NOT  DETECTED NOT DETECTED Final   Coronavirus OC43 NOT DETECTED NOT DETECTED Final   Metapneumovirus NOT DETECTED NOT DETECTED Final   Rhinovirus / Enterovirus NOT DETECTED NOT DETECTED Final   Influenza A NOT DETECTED NOT DETECTED Final   Influenza B NOT DETECTED NOT DETECTED Final   Parainfluenza Virus 1 NOT DETECTED NOT DETECTED Final   Parainfluenza Virus 2 NOT DETECTED NOT DETECTED Final   Parainfluenza Virus 3 NOT DETECTED NOT DETECTED Final   Parainfluenza Virus 4 NOT DETECTED NOT DETECTED Final   Respiratory Syncytial Virus NOT DETECTED NOT DETECTED Final   Bordetella pertussis NOT DETECTED NOT DETECTED Final   Chlamydophila pneumoniae NOT DETECTED NOT DETECTED Final   Mycoplasma pneumoniae NOT DETECTED NOT DETECTED Final    Comment: Performed at [facility], [address], [place], [place] [address]    RADIOLOGY STUDIES/RESULTS: Ct Chest W Contrast  Result Date: 04/14/2018 CLINICAL DATA:  Acute resp illness, > 64 years old; Abd pain, acute, generalized. EXAM: CT CHEST, ABDOMEN, AND PELVIS WITH CONTRAST TECHNIQUE: Multidetector CT imaging of the chest, abdomen and pelvis was performed following the standard protocol during bolus administration of intravenous contrast. CONTRAST:  ISOVUE-300 IOPAMIDOL (ISOVUE-300) INJECTION 61% COMPARISON:  Chest radiograph earlier this day.  Chest CT 12/16/2011 FINDINGS: CT CHEST FINDINGS Cardiovascular: Ectasia of the ascending thoracic aorta at 4 cm, unchanged. Mild aortic atherosclerosis. No dissection. There is an aberrant small branch vessel from the proximal aspect of the descending aorta with serpiginous mediastinal collaterals extending to the subcarinal region and both hila, right greater than left. Vessel hypertrophy since prior exam, with numerous small perihilar and subcarinal vessels. No filling defects in the central pulmonary arteries to suggest pulmonary embolus. Mild cardiomegaly. There are coronary artery calcifications. No  pericardial effusion. The IVC is patent. Mediastinum/Nodes: Multiple small reactive appearing lymph nodes throughout the mediastinum, all subcentimeter. Patulous esophagus as before. No dominant thyroid nodule. Lungs/Pleura: Central bronchial narrowing appears most severe involving the right upper lobe. There is associated bronchial thickening, multifocal septal thickening, ground-glass opacity, and peribronchovascular nodularity. Areas of architectural distortion in the right upper lobe again seen. Volume loss in the right middle lobe. Focal airspace disease in the paramediastinal anterior right middle lobe. Fissural thickening along the minor fissure. Biapical pleuroparenchymal scarring. Small left and trace right pleural effusion. Retained mucus in the upper trachea. Musculoskeletal: Minimal superior endplate compression fracture of T2 and T3, age indeterminate but likely chronic. No suspicious osseous abnormality. CT ABDOMEN PELVIS FINDINGS Hepatobiliary: No focal liver abnormality is seen. No gallstones, gallbladder wall thickening, or biliary dilatation. Pancreas: Mild pancreatic ductal prominence without peripancreatic inflammation or pancreatic mass. Spleen: Normal in size without focal abnormality. Adrenals/Urinary Tract: Normal adrenal glands. No hydronephrosis or perinephric edema. Homogeneous renal enhancement with symmetric excretion on delayed phase imaging. Urinary bladder is decompressed by Foley catheter. Stomach/Bowel: Bowel evaluation is limited by the absence of enteric contrast and paucity of intra-abdominal fat. Diffuse wall thickening of the gastric fundus. Coarse calcifications versus high-density ingested material in the distal stomach/duodenum, image 70 series 2. Fluid-filled noninflamed small bowel. No evidence of obstruction. High-riding cecum in the mid abdomen. Normal appendix. Moderate colonic stool burden. No colonic wall thickening or inflammatory change. Vascular/Lymphatic: Aorto  bi-iliac atherosclerosis. No aneurysm. Prominent periuterine and adnexal vascularity with dilatation of the left ovarian vein at 6 mm. No abdominal or pelvic adenopathy. Reproductive: Prominent periuterine and adnexal vascularity. No adnexal mass. Atrophic  uterus. Other: No ascites or free air. Musculoskeletal: There are no acute or suspicious osseous abnormalities. IMPRESSION: 1. Abnormal appearance of the chest with progressive findings from 2013 CT. Progressive central bronchial narrowing with areas of architectural distortion. Development of peribronchovascular nodularity throughout both lungs. Overall findings may be due to atypical infection or inflammatory process. Recommend pulmonary consultation. 2. Hypertrophied small mediastinal vessels of uncertain significance. 3. Stable ascending thoracic aortic aneurysm of 4 cm. 4. Wall thickening of the gastric fundus, nonspecific for gastritis versus peptic ulcer disease. Coarse calcification versus high-density ingested material in the duodenum. 5. Prominent periuterine vascularity and dilatation of the ovarian veins, suggesting pelvic congestion syndrome. 6.  Aortic Atherosclerosis (ICD10-I70.0). Electronically Signed   By: Narda Rutherford M.D.   On: 04/14/2018 00:08   Ct Abdomen Pelvis W Contrast  Result Date: 04/14/2018 CLINICAL DATA:  Acute resp illness, > 52 years old; Abd pain, acute, generalized. EXAM: CT CHEST, ABDOMEN, AND PELVIS WITH CONTRAST TECHNIQUE: Multidetector CT imaging of the chest, abdomen and pelvis was performed following the standard protocol during bolus administration of intravenous contrast. CONTRAST:  ISOVUE-300 IOPAMIDOL (ISOVUE-300) INJECTION 61% COMPARISON:  Chest radiograph earlier this day.  Chest CT 12/16/2011 FINDINGS: CT CHEST FINDINGS Cardiovascular: Ectasia of the ascending thoracic aorta at 4 cm, unchanged. Mild aortic atherosclerosis. No dissection. There is an aberrant small branch vessel from the proximal aspect of  the descending aorta with serpiginous mediastinal collaterals extending to the subcarinal region and both hila, right greater than left. Vessel hypertrophy since prior exam, with numerous small perihilar and subcarinal vessels. No filling defects in the central pulmonary arteries to suggest pulmonary embolus. Mild cardiomegaly. There are coronary artery calcifications. No pericardial effusion. The IVC is patent. Mediastinum/Nodes: Multiple small reactive appearing lymph nodes throughout the mediastinum, all subcentimeter. Patulous esophagus as before. No dominant thyroid nodule. Lungs/Pleura: Central bronchial narrowing appears most severe involving the right upper lobe. There is associated bronchial thickening, multifocal septal thickening, ground-glass opacity, and peribronchovascular nodularity. Areas of architectural distortion in the right upper lobe again seen. Volume loss in the right middle lobe. Focal airspace disease in the paramediastinal anterior right middle lobe. Fissural thickening along the minor fissure. Biapical pleuroparenchymal scarring. Small left and trace right pleural effusion. Retained mucus in the upper trachea. Musculoskeletal: Minimal superior endplate compression fracture of T2 and T3, age indeterminate but likely chronic. No suspicious osseous abnormality. CT ABDOMEN PELVIS FINDINGS Hepatobiliary: No focal liver abnormality is seen. No gallstones, gallbladder wall thickening, or biliary dilatation. Pancreas: Mild pancreatic ductal prominence without peripancreatic inflammation or pancreatic mass. Spleen: Normal in size without focal abnormality. Adrenals/Urinary Tract: Normal adrenal glands. No hydronephrosis or perinephric edema. Homogeneous renal enhancement with symmetric excretion on delayed phase imaging. Urinary bladder is decompressed by Foley catheter. Stomach/Bowel: Bowel evaluation is limited by the absence of enteric contrast and paucity of intra-abdominal fat. Diffuse wall  thickening of the gastric fundus. Coarse calcifications versus high-density ingested material in the distal stomach/duodenum, image 70 series 2. Fluid-filled noninflamed small bowel. No evidence of obstruction. High-riding cecum in the mid abdomen. Normal appendix. Moderate colonic stool burden. No colonic wall thickening or inflammatory change. Vascular/Lymphatic: Aorto bi-iliac atherosclerosis. No aneurysm. Prominent periuterine and adnexal vascularity with dilatation of the left ovarian vein at 6 mm. No abdominal or pelvic adenopathy. Reproductive: Prominent periuterine and adnexal vascularity. No adnexal mass. Atrophic uterus. Other: No ascites or free air. Musculoskeletal: There are no acute or suspicious osseous abnormalities. IMPRESSION: 1. Abnormal appearance of the chest with progressive  findings from 2013 CT. Progressive central bronchial narrowing with areas of architectural distortion. Development of peribronchovascular nodularity throughout both lungs. Overall findings may be due to atypical infection or inflammatory process. Recommend pulmonary consultation. 2. Hypertrophied small mediastinal vessels of uncertain significance. 3. Stable ascending thoracic aortic aneurysm of 4 cm. 4. Wall thickening of the gastric fundus, nonspecific for gastritis versus peptic ulcer disease. Coarse calcification versus high-density ingested material in the duodenum. 5. Prominent periuterine vascularity and dilatation of the ovarian veins, suggesting pelvic congestion syndrome. 6.  Aortic Atherosclerosis (ICD10-I70.0). Electronically Signed   By: Narda RutherfordMelanie  Sanford M.D.   On: 04/14/2018 00:08   Dg Chest Portable 1 View  Result Date: 04/13/2018 CLINICAL DATA:  Cough, shortness of breath. EXAM: PORTABLE CHEST 1 VIEW COMPARISON:  07/11/2013 FINDINGS: Unchanged upper normal heart size. Atherosclerosis of the aortic arch. Increased peribronchial thickening from prior exam, possible mild Kerley B-lines. Blunting of the  costophrenic angles appears similar to prior. Biapical pleuroparenchymal scarring. Peripheral ill-defined opacities in the right upper and left mid lung zones. No pneumothorax. No acute osseous abnormalities. IMPRESSION: 1. Increased peribronchial thickening from prior exam, bronchitis versus mild pulmonary edema. 2. Ill-defined opacities in the right upper and left mid lung zones, favoring atelectasis or scarring, mild pneumonitis/pneumonia could have a similar appearance. Electronically Signed   By: Narda RutherfordMelanie  Sanford M.D.   On: 04/13/2018 21:59     LOS: 1 day   Jeoffrey MassedShanker Ender Rorke, MD  [facility]  If 7PM-7AM, please contact night-coverage  Please page via [email or web address] [email or web address]-click on MD name and type text message  04/15/2018, 3:15 PM

## 2018-04-15 NOTE — Evaluation (Signed)
Physical Therapy Evaluation Patient Details Name: Tracy Kerr MRN: [id number] DOB: 01-26-1934 Today's Date: 04/15/2018   History of Present Illness  Tracy Kerr is an 85yo female who comes to [facility] on 1/6 after 2D SOB , cough, fever, admitted with bronchitis. PTA son reports pt was independent in ADL, but performed mostly household distance AMB without assistive device. Per son, pt used fixed objects for external stabilization, but has not falls history. She needed some physical assistance to perfrom entry steps. She lives with he rother son adn extended family, and has 24/7 assistance as needed.   Clinical Impression  Pt admitted with above diagnosis. Pt currently with functional limitations due to the deficits listed below (see "PT Problem List"). Upon entry, pt in bed, son present who provides interpreter services. The pt is awake and agreeable to participate. The pt is alert and oriented to self, pleasant, and following simple commands consistently. Additional effort and slow movement for all mobility, but pt reports to be at her baseline. Son reports her mobility has improved since PTA, but she has concerning gait instability before falling ill. Pt requires 2L/min O2 for AMB  During which SpO2 drops to 89% several times. Functional mobility assessment demonstrates increased effort/time requirements, fair tolerance, but no frank need for physical assistance, whereas the patient performed these at a similar level of independence PTA. Pt will benefit from skilled PT intervention to increase independence and safety with basic mobility in preparation for discharge to the venue listed below.       Follow Up Recommendations Home health PT    Equipment Recommendations  Rolling walker with 5" wheels    Recommendations for Other Services       Precautions / Restrictions Precautions Precautions: None      Mobility  Bed Mobility Overal bed mobility: Modified Independent             General bed  mobility comments: additional time and effort.   Transfers Overall transfer level: Needs assistance Equipment used: Rolling walker (2 wheeled) Transfers: Sit to/from Stand Sit to Stand: Min guard         General transfer comment: additional time and effort, but improved effort with repitition, transfers to standing 6x in session.   Ambulation/Gait Ambulation/Gait assistance: Supervision Gait Distance (Feet): 48 Feet Assistive device: Rolling walker (2 wheeled)       General Gait Details: slow and symetrical, novel RW use, but used appropriately and safely without heavy UE reliance. Requires 2L/min O2 and drops to 89% AMB in room.   Stairs            Wheelchair Mobility    Modified Rankin (Stroke Patients Only)       Balance Overall balance assessment: Modified Independent;No apparent balance deficits (not formally assessed)                                           Pertinent Vitals/Pain      Home Living Family/patient expects to be discharged to:: Private residence   Available Help at Discharge: Family;Available 24 hours/day Type of Home: House Home Access: Stairs to enter Entrance Stairs-Rails: None Entrance Stairs-Number of Steps: 3 (family helps pt with steps)  Home Layout: One level Home Equipment: None Additional Comments: no falls, but family reports instability. Furniture walker.     Prior Function           Comments:  independent with ADL; mostly household AMB for several past months.     Hand Dominance   Dominant Hand: Right    Extremity/Trunk Assessment   Upper Extremity Assessment Upper Extremity Assessment: Generalized weakness    Lower Extremity Assessment Lower Extremity Assessment: Generalized weakness;Overall [facility] for tasks assessed    Cervical / Trunk Assessment Cervical / Trunk Assessment: Normal  Communication   Communication: Prefers language other than English(vietnamese- montinar koho)  Cognition  Arousal/Alertness: Awake/alert Behavior During Therapy: WFL for tasks assessed/performed Overall Cognitive Status: Difficult to assess                                        General Comments      Exercises     Assessment/Plan    PT Assessment Patient needs continued PT services  PT Problem List Decreased strength;Decreased activity tolerance;Decreased balance;Decreased mobility       PT Treatment Interventions DME instruction;Therapeutic exercise;Functional mobility training;Therapeutic activities;Stair training;Balance training;Patient/family education    PT Goals (Current goals can be found in the Care Plan section)  Acute Rehab PT Goals Patient Stated Goal: return to home improve AMB quality/safety  PT Goal Formulation: With family Time For Goal Achievement: 04/22/18 Potential to Achieve Goals: Good    Frequency Min 3X/week   Barriers to discharge        Co-evaluation               AM-PAC PT "6 Clicks" Mobility  Outcome Measure Help needed turning from your back to your side while in a flat bed without using bedrails?: A Little Help needed moving from lying on your back to sitting on the side of a flat bed without using bedrails?: A Little Help needed moving to and from a bed to a chair (including a wheelchair)?: A Little Help needed standing up from a chair using your arms (e.g., wheelchair or bedside chair)?: A Little Help needed to walk in hospital room?: A Little Help needed climbing 3-5 steps with a railing? : A Little 6 Click Score: 18    End of Session Equipment Utilized During Treatment: Oxygen Activity Tolerance: Patient tolerated treatment well;Treatment limited secondary to medical complications (Comment)(AF and desaturation on 2L ) Patient left: in bed;with family/visitor present;with call bell/phone within reach(Son in room. ) Nurse Communication: Mobility status PT Visit Diagnosis: Muscle weakness (generalized) (M62.81);Other  abnormalities of gait and mobility (R26.89);Difficulty in walking, not elsewhere classified (R26.2)    Time: 3500-9381 PT Time Calculation (min) (ACUTE ONLY): 25 min   Charges:   PT Evaluation $PT Eval Low Complexity: 1 Low PT Treatments $Therapeutic Activity: 8-22 mins        4:33 PM, 04/15/18 Rosamaria Lints, PT, DPT Physical Therapist - [place] [phone] (Pager)  [phone] (Office)     Tracy Kerr 04/15/2018, 4:30 PM

## 2018-04-16 DIAGNOSIS — I10 Essential (primary) hypertension: Secondary | ICD-10-CM

## 2018-04-16 MED ORDER — AMLODIPINE BESYLATE 5 MG PO TABS: 5.0000 mg | ORAL_TABLET | Freq: Every day | ORAL | 0 refills | Status: AC

## 2018-04-16 MED ORDER — ACETAMINOPHEN 500 MG PO TABS: 500.0000 mg | ORAL_TABLET | Freq: Four times a day (QID) | ORAL | 0 refills | Status: AC | PRN

## 2018-04-16 MED ORDER — AZITHROMYCIN 500 MG PO TABS: 500.0000 mg | ORAL_TABLET | Freq: Every day | ORAL | Status: DC

## 2018-04-16 MED ORDER — AMOXICILLIN-POT CLAVULANATE 875-125 MG PO TABS: 1.0000 | ORAL_TABLET | Freq: Two times a day (BID) | ORAL | 0 refills | Status: AC

## 2018-04-16 MED FILL — AMOX-CLAV 875-125 MG TABLET: 875-125 | 5 days supply | Qty: 10 | Fill #0

## 2018-04-16 MED FILL — AMLODIPINE BESYLATE 5 MG TA: 5 | 30 days supply | Qty: 30 | Fill #0

## 2018-04-16 NOTE — Progress Notes (Signed)
Tracy Kerr to be D/C'd home with home health per MD order. Discussed with the patient and daughter Tracy Kerr and all questions fully answered. VVS, Skin clean, dry and intact without evidence of skin break down, no evidence of skin tears noted. Walker and medications delivered to patient. IV catheter discontinued intact. Site without signs and symptoms of complications. Dressing and pressure applied.  An After Visit Summary was printed and given to the patient.  Patient escorted via WC, and D/C home via private auto.  [name]  04/16/2018 11:26 AM

## 2018-04-16 NOTE — Discharge Summary (Signed)
PATIENT DETAILS Name: Tracy Kerr Age: 85 y.o. Sex: female Date of Birth: Mar 02, 1934 MRN: [id number]. Admitting Physician: Lorretta Harp, MD WUJ:[id number], Pcp Not In  Admit Date: 04/13/2018 Discharge date: 04/16/2018  Recommendations for Outpatient Follow-up:  1. Follow up with PCP in 1-2 weeks 2. Please obtain BMP/CBC in one week  Admitted From:  Home  Disposition: Home   Home Health: Yes  Equipment/Devices: None  Discharge Condition: Stable  CODE STATUS: FULL CODE  Diet recommendation:  Heart Healthy  Brief Summary: See H&P, Labs, Consult and Test reports for all details in brief, Patient is a 85 y.o. female 3 of hypertension-noncompliant with medications-does not follow with PCP (by choice) presenting with 2-3-day history of fever, cough-thought to have pneumonia-and admitted to the hospitalist service  Brief Hospital Course: Sepsis secondary to pneumonia: Sepsis pathophysiology is resolved, clinically improved-feels much better-less cough-no longer short of breath.  Respiratory virus panel negative-blood cultures negative-we will transition to Augmentin for a few more days.  Patient needs to follow-up with her primary care practitioner for post hospital visit in the next 1-2 weeks  Hypertension:  BP improved-continue amlodipine on discharge.  She has been noncompliant in the past-has been counseled extensively.    Hypokalemia: Repleted, recheck lites at next visit follow-up visit with PCP  Abnormal CT appearance of the chest: Appears to have progressive central bronchial narrowing and architectural distortion-talked with family-have arranged for outpatient follow-up with pulmonology-for a repeat CT chest following a course of antimicrobial therapy.    Procedures/Studies: None  Discharge Diagnoses:  Principal Problem:   Acute bronchitis Active Problems:   Sepsis [facility])   Discharge Instructions:  Activity:  As tolerated with Full fall precautions use walker/cane  & assistance as needed  Discharge Instructions    Call MD for:  difficulty breathing, headache or visual disturbances   Complete by:  As directed    Call MD for:  temperature >100.4   Complete by:  As directed    Diet - low sodium heart healthy   Complete by:  As directed    Discharge instructions   Complete by:  As directed    Follow with Primary MD in 1 week  Follow with Pulmonologist (Lung Doctor) as instructed  Low salt diet  Take blood pressure medications daily  Please get a complete blood count and chemistry panel checked by your Primary MD at your next visit, and again as instructed by your Primary MD.  Get Medicines reviewed and adjusted: Please take all your medications with you for your next visit with your Primary MD  Laboratory/radiological data: Please request your Primary MD to go over all hospital tests and procedure/radiological results at the follow up, please ask your Primary MD to get all Hospital records sent to his/her office.  In some cases, they will be blood work, cultures and biopsy results pending at the time of your discharge. Please request that your primary care M.D. follows up on these results.  Also Note the following: If you experience worsening of your admission symptoms, develop shortness of breath, life threatening emergency, suicidal or homicidal thoughts you must seek medical attention immediately by calling 911 or calling your MD immediately  if symptoms less severe.  You must read complete instructions/literature along with all the possible adverse reactions/side effects for all the Medicines you take and that have been prescribed to you. Take any new Medicines after you have completely understood and accpet all the possible adverse reactions/side effects.   Do not drive when  taking Pain medications or sleeping medications (Benzodaizepines)  Do not take more than prescribed Pain, Sleep and Anxiety Medications. It is not advisable to combine  anxiety,sleep and pain medications without talking with your primary care practitioner  Special Instructions: If you have smoked or chewed Tobacco  in the last 2 yrs please stop smoking, stop any regular Alcohol  and or any Recreational drug use.  Wear Seat belts while driving.  Please note: You were cared for by a hospitalist during your hospital stay. Once you are discharged, your primary care physician will handle any further medical issues. Please note that NO REFILLS for any discharge medications will be authorized once you are discharged, as it is imperative that you return to your primary care physician (or establish a relationship with a primary care physician if you do not have one) for your post hospital discharge needs so that they can reassess your need for medications and monitor your lab values.   Increase activity slowly   Complete by:  As directed      Allergies as of 04/16/2018   No Known Allergies     Medication List    TAKE these medications   acetaminophen 500 MG tablet Commonly known as:  TYLENOL Take 1 tablet (500 mg total) by mouth every 6 (six) hours as needed for mild pain or fever.   amLODipine 5 MG tablet Commonly known as:  NORVASC Take 1 tablet (5 mg total) by mouth daily.   amoxicillin-clavulanate 875-125 MG tablet Commonly known as:  AUGMENTIN Take 1 tablet by mouth 2 (two) times daily.            Durable Medical Equipment  (From admission, onward)         Start     Ordered   04/16/18 0933  For home use only DME Walker rolling  Once    Question:  Patient needs a walker to treat with the following condition  Answer:  Weakness   04/16/18 0934         Follow-up Information    [facility].   Specialty:  Home Health Services Why:  home health services arranged Contact information: [address] [place] [address] [phone]        Ivonne Andrew, NP Follow up on 04/23/2018.   Specialty:   Pulmonary Disease Why:  appointment with 9 am Contact information: [address] [place] [address] [phone]          No Known Allergies  Consultations:   None   Other Procedures/Studies: Ct Chest W Contrast  Result Date: 04/14/2018 CLINICAL DATA:  Acute resp illness, > 44 years old; Abd pain, acute, generalized. EXAM: CT CHEST, ABDOMEN, AND PELVIS WITH CONTRAST TECHNIQUE: Multidetector CT imaging of the chest, abdomen and pelvis was performed following the standard protocol during bolus administration of intravenous contrast. CONTRAST:  ISOVUE-300 IOPAMIDOL (ISOVUE-300) INJECTION 61% COMPARISON:  Chest radiograph earlier this day.  Chest CT 12/16/2011 FINDINGS: CT CHEST FINDINGS Cardiovascular: Ectasia of the ascending thoracic aorta at 4 cm, unchanged. Mild aortic atherosclerosis. No dissection. There is an aberrant small branch vessel from the proximal aspect of the descending aorta with serpiginous mediastinal collaterals extending to the subcarinal region and both hila, right greater than left. Vessel hypertrophy since prior exam, with numerous small perihilar and subcarinal vessels. No filling defects in the central pulmonary arteries to suggest pulmonary embolus. Mild cardiomegaly. There are coronary artery calcifications. No pericardial effusion.  The IVC is patent. Mediastinum/Nodes: Multiple small reactive appearing lymph nodes throughout the mediastinum, all subcentimeter. Patulous esophagus as before. No dominant thyroid nodule. Lungs/Pleura: Central bronchial narrowing appears most severe involving the right upper lobe. There is associated bronchial thickening, multifocal septal thickening, ground-glass opacity, and peribronchovascular nodularity. Areas of architectural distortion in the right upper lobe again seen. Volume loss in the right middle lobe. Focal airspace disease in the paramediastinal anterior right middle lobe. Fissural thickening along the minor  fissure. Biapical pleuroparenchymal scarring. Small left and trace right pleural effusion. Retained mucus in the upper trachea. Musculoskeletal: Minimal superior endplate compression fracture of T2 and T3, age indeterminate but likely chronic. No suspicious osseous abnormality. CT ABDOMEN PELVIS FINDINGS Hepatobiliary: No focal liver abnormality is seen. No gallstones, gallbladder wall thickening, or biliary dilatation. Pancreas: Mild pancreatic ductal prominence without peripancreatic inflammation or pancreatic mass. Spleen: Normal in size without focal abnormality. Adrenals/Urinary Tract: Normal adrenal glands. No hydronephrosis or perinephric edema. Homogeneous renal enhancement with symmetric excretion on delayed phase imaging. Urinary bladder is decompressed by Foley catheter. Stomach/Bowel: Bowel evaluation is limited by the absence of enteric contrast and paucity of intra-abdominal fat. Diffuse wall thickening of the gastric fundus. Coarse calcifications versus high-density ingested material in the distal stomach/duodenum, image 70 series 2. Fluid-filled noninflamed small bowel. No evidence of obstruction. High-riding cecum in the mid abdomen. Normal appendix. Moderate colonic stool burden. No colonic wall thickening or inflammatory change. Vascular/Lymphatic: Aorto bi-iliac atherosclerosis. No aneurysm. Prominent periuterine and adnexal vascularity with dilatation of the left ovarian vein at 6 mm. No abdominal or pelvic adenopathy. Reproductive: Prominent periuterine and adnexal vascularity. No adnexal mass. Atrophic uterus. Other: No ascites or free air. Musculoskeletal: There are no acute or suspicious osseous abnormalities. IMPRESSION: 1. Abnormal appearance of the chest with progressive findings from 2013 CT. Progressive central bronchial narrowing with areas of architectural distortion. Development of peribronchovascular nodularity throughout both lungs. Overall findings may be due to atypical infection  or inflammatory process. Recommend pulmonary consultation. 2. Hypertrophied small mediastinal vessels of uncertain significance. 3. Stable ascending thoracic aortic aneurysm of 4 cm. 4. Wall thickening of the gastric fundus, nonspecific for gastritis versus peptic ulcer disease. Coarse calcification versus high-density ingested material in the duodenum. 5. Prominent periuterine vascularity and dilatation of the ovarian veins, suggesting pelvic congestion syndrome. 6.  Aortic Atherosclerosis (ICD10-I70.0). Electronically Signed   By: Narda Rutherford M.D.   On: 04/14/2018 00:08   Ct Abdomen Pelvis W Contrast  Result Date: 04/14/2018 CLINICAL DATA:  Acute resp illness, > 69 years old; Abd pain, acute, generalized. EXAM: CT CHEST, ABDOMEN, AND PELVIS WITH CONTRAST TECHNIQUE: Multidetector CT imaging of the chest, abdomen and pelvis was performed following the standard protocol during bolus administration of intravenous contrast. CONTRAST:  ISOVUE-300 IOPAMIDOL (ISOVUE-300) INJECTION 61% COMPARISON:  Chest radiograph earlier this day.  Chest CT 12/16/2011 FINDINGS: CT CHEST FINDINGS Cardiovascular: Ectasia of the ascending thoracic aorta at 4 cm, unchanged. Mild aortic atherosclerosis. No dissection. There is an aberrant small branch vessel from the proximal aspect of the descending aorta with serpiginous mediastinal collaterals extending to the subcarinal region and both hila, right greater than left. Vessel hypertrophy since prior exam, with numerous small perihilar and subcarinal vessels. No filling defects in the central pulmonary arteries to suggest pulmonary embolus. Mild cardiomegaly. There are coronary artery calcifications. No pericardial effusion. The IVC is patent. Mediastinum/Nodes: Multiple small reactive appearing lymph nodes throughout the mediastinum, all subcentimeter. Patulous esophagus as before. No dominant thyroid nodule. Lungs/Pleura:  Central bronchial narrowing appears most severe  involving the right upper lobe. There is associated bronchial thickening, multifocal septal thickening, ground-glass opacity, and peribronchovascular nodularity. Areas of architectural distortion in the right upper lobe again seen. Volume loss in the right middle lobe. Focal airspace disease in the paramediastinal anterior right middle lobe. Fissural thickening along the minor fissure. Biapical pleuroparenchymal scarring. Small left and trace right pleural effusion. Retained mucus in the upper trachea. Musculoskeletal: Minimal superior endplate compression fracture of T2 and T3, age indeterminate but likely chronic. No suspicious osseous abnormality. CT ABDOMEN PELVIS FINDINGS Hepatobiliary: No focal liver abnormality is seen. No gallstones, gallbladder wall thickening, or biliary dilatation. Pancreas: Mild pancreatic ductal prominence without peripancreatic inflammation or pancreatic mass. Spleen: Normal in size without focal abnormality. Adrenals/Urinary Tract: Normal adrenal glands. No hydronephrosis or perinephric edema. Homogeneous renal enhancement with symmetric excretion on delayed phase imaging. Urinary bladder is decompressed by Foley catheter. Stomach/Bowel: Bowel evaluation is limited by the absence of enteric contrast and paucity of intra-abdominal fat. Diffuse wall thickening of the gastric fundus. Coarse calcifications versus high-density ingested material in the distal stomach/duodenum, image 70 series 2. Fluid-filled noninflamed small bowel. No evidence of obstruction. High-riding cecum in the mid abdomen. Normal appendix. Moderate colonic stool burden. No colonic wall thickening or inflammatory change. Vascular/Lymphatic: Aorto bi-iliac atherosclerosis. No aneurysm. Prominent periuterine and adnexal vascularity with dilatation of the left ovarian vein at 6 mm. No abdominal or pelvic adenopathy. Reproductive: Prominent periuterine and adnexal vascularity. No adnexal mass. Atrophic uterus. Other: No  ascites or free air. Musculoskeletal: There are no acute or suspicious osseous abnormalities. IMPRESSION: 1. Abnormal appearance of the chest with progressive findings from 2013 CT. Progressive central bronchial narrowing with areas of architectural distortion. Development of peribronchovascular nodularity throughout both lungs. Overall findings may be due to atypical infection or inflammatory process. Recommend pulmonary consultation. 2. Hypertrophied small mediastinal vessels of uncertain significance. 3. Stable ascending thoracic aortic aneurysm of 4 cm. 4. Wall thickening of the gastric fundus, nonspecific for gastritis versus peptic ulcer disease. Coarse calcification versus high-density ingested material in the duodenum. 5. Prominent periuterine vascularity and dilatation of the ovarian veins, suggesting pelvic congestion syndrome. 6.  Aortic Atherosclerosis (ICD10-I70.0). Electronically Signed   By: Narda RutherfordMelanie  Sanford M.D.   On: 04/14/2018 00:08   Dg Chest Portable 1 View  Result Date: 04/13/2018 CLINICAL DATA:  Cough, shortness of breath. EXAM: PORTABLE CHEST 1 VIEW COMPARISON:  07/11/2013 FINDINGS: Unchanged upper normal heart size. Atherosclerosis of the aortic arch. Increased peribronchial thickening from prior exam, possible mild Kerley B-lines. Blunting of the costophrenic angles appears similar to prior. Biapical pleuroparenchymal scarring. Peripheral ill-defined opacities in the right upper and left mid lung zones. No pneumothorax. No acute osseous abnormalities. IMPRESSION: 1. Increased peribronchial thickening from prior exam, bronchitis versus mild pulmonary edema. 2. Ill-defined opacities in the right upper and left mid lung zones, favoring atelectasis or scarring, mild pneumonitis/pneumonia could have a similar appearance. Electronically Signed   By: Narda RutherfordMelanie  Sanford M.D.   On: 04/13/2018 21:59     TODAY-DAY OF DISCHARGE:  Subjective:   Tracy Kerr today has no headache,no chest abdominal  pain,no new weakness tingling or numbness, feels much better wants to go home today.  Very anxious to go home today-in fact she was almost adamant on leaving yesterday-but was convinced by family to stay an extra day.  Objective:   Blood pressure (!) 153/78, pulse 100, temperature 99.2 F (37.3 C), resp. rate 18, height 5\' 2"  (1.575 m), weight  54.4 kg, SpO2 95 %.  Intake/Output Summary (Last 24 hours) at 04/16/2018 0952 Last data filed at 04/16/2018 0532 Gross per 24 hour  Intake 1660.41 ml  Output -  Net 1660.41 ml   Filed Weights   04/13/18 2124  Weight: 54.4 kg    Exam: Awake Alert, Oriented *3, No new F.N deficits, Normal affect [place].AT,PERRAL Supple Neck,No JVD, No cervical lymphadenopathy appriciated.  Symmetrical Chest wall movement, Good air movement bilaterally, CTAB RRR,No Gallops,Rubs or new Murmurs, No Parasternal Heave +ve B.Sounds, Abd Soft, Non tender, No organomegaly appriciated, No rebound -guarding or rigidity. No Cyanosis, Clubbing or edema, No new Rash or bruise   PERTINENT RADIOLOGIC STUDIES: Ct Chest W Contrast  Result Date: 04/14/2018 CLINICAL DATA:  Acute resp illness, > 28 years old; Abd pain, acute, generalized. EXAM: CT CHEST, ABDOMEN, AND PELVIS WITH CONTRAST TECHNIQUE: Multidetector CT imaging of the chest, abdomen and pelvis was performed following the standard protocol during bolus administration of intravenous contrast. CONTRAST:  ISOVUE-300 IOPAMIDOL (ISOVUE-300) INJECTION 61% COMPARISON:  Chest radiograph earlier this day.  Chest CT 12/16/2011 FINDINGS: CT CHEST FINDINGS Cardiovascular: Ectasia of the ascending thoracic aorta at 4 cm, unchanged. Mild aortic atherosclerosis. No dissection. There is an aberrant small branch vessel from the proximal aspect of the descending aorta with serpiginous mediastinal collaterals extending to the subcarinal region and both hila, right greater than left. Vessel hypertrophy since prior exam, with numerous small  perihilar and subcarinal vessels. No filling defects in the central pulmonary arteries to suggest pulmonary embolus. Mild cardiomegaly. There are coronary artery calcifications. No pericardial effusion. The IVC is patent. Mediastinum/Nodes: Multiple small reactive appearing lymph nodes throughout the mediastinum, all subcentimeter. Patulous esophagus as before. No dominant thyroid nodule. Lungs/Pleura: Central bronchial narrowing appears most severe involving the right upper lobe. There is associated bronchial thickening, multifocal septal thickening, ground-glass opacity, and peribronchovascular nodularity. Areas of architectural distortion in the right upper lobe again seen. Volume loss in the right middle lobe. Focal airspace disease in the paramediastinal anterior right middle lobe. Fissural thickening along the minor fissure. Biapical pleuroparenchymal scarring. Small left and trace right pleural effusion. Retained mucus in the upper trachea. Musculoskeletal: Minimal superior endplate compression fracture of T2 and T3, age indeterminate but likely chronic. No suspicious osseous abnormality. CT ABDOMEN PELVIS FINDINGS Hepatobiliary: No focal liver abnormality is seen. No gallstones, gallbladder wall thickening, or biliary dilatation. Pancreas: Mild pancreatic ductal prominence without peripancreatic inflammation or pancreatic mass. Spleen: Normal in size without focal abnormality. Adrenals/Urinary Tract: Normal adrenal glands. No hydronephrosis or perinephric edema. Homogeneous renal enhancement with symmetric excretion on delayed phase imaging. Urinary bladder is decompressed by Foley catheter. Stomach/Bowel: Bowel evaluation is limited by the absence of enteric contrast and paucity of intra-abdominal fat. Diffuse wall thickening of the gastric fundus. Coarse calcifications versus high-density ingested material in the distal stomach/duodenum, image 70 series 2. Fluid-filled noninflamed small bowel. No evidence  of obstruction. High-riding cecum in the mid abdomen. Normal appendix. Moderate colonic stool burden. No colonic wall thickening or inflammatory change. Vascular/Lymphatic: Aorto bi-iliac atherosclerosis. No aneurysm. Prominent periuterine and adnexal vascularity with dilatation of the left ovarian vein at 6 mm. No abdominal or pelvic adenopathy. Reproductive: Prominent periuterine and adnexal vascularity. No adnexal mass. Atrophic uterus. Other: No ascites or free air. Musculoskeletal: There are no acute or suspicious osseous abnormalities. IMPRESSION: 1. Abnormal appearance of the chest with progressive findings from 2013 CT. Progressive central bronchial narrowing with areas of architectural distortion. Development of peribronchovascular nodularity throughout both  lungs. Overall findings may be due to atypical infection or inflammatory process. Recommend pulmonary consultation. 2. Hypertrophied small mediastinal vessels of uncertain significance. 3. Stable ascending thoracic aortic aneurysm of 4 cm. 4. Wall thickening of the gastric fundus, nonspecific for gastritis versus peptic ulcer disease. Coarse calcification versus high-density ingested material in the duodenum. 5. Prominent periuterine vascularity and dilatation of the ovarian veins, suggesting pelvic congestion syndrome. 6.  Aortic Atherosclerosis (ICD10-I70.0). Electronically Signed   By: Narda Rutherford M.D.   On: 04/14/2018 00:08   Ct Abdomen Pelvis W Contrast  Result Date: 04/14/2018 CLINICAL DATA:  Acute resp illness, > 58 years old; Abd pain, acute, generalized. EXAM: CT CHEST, ABDOMEN, AND PELVIS WITH CONTRAST TECHNIQUE: Multidetector CT imaging of the chest, abdomen and pelvis was performed following the standard protocol during bolus administration of intravenous contrast. CONTRAST:  ISOVUE-300 IOPAMIDOL (ISOVUE-300) INJECTION 61% COMPARISON:  Chest radiograph earlier this day.  Chest CT 12/16/2011 FINDINGS: CT CHEST FINDINGS  Cardiovascular: Ectasia of the ascending thoracic aorta at 4 cm, unchanged. Mild aortic atherosclerosis. No dissection. There is an aberrant small branch vessel from the proximal aspect of the descending aorta with serpiginous mediastinal collaterals extending to the subcarinal region and both hila, right greater than left. Vessel hypertrophy since prior exam, with numerous small perihilar and subcarinal vessels. No filling defects in the central pulmonary arteries to suggest pulmonary embolus. Mild cardiomegaly. There are coronary artery calcifications. No pericardial effusion. The IVC is patent. Mediastinum/Nodes: Multiple small reactive appearing lymph nodes throughout the mediastinum, all subcentimeter. Patulous esophagus as before. No dominant thyroid nodule. Lungs/Pleura: Central bronchial narrowing appears most severe involving the right upper lobe. There is associated bronchial thickening, multifocal septal thickening, ground-glass opacity, and peribronchovascular nodularity. Areas of architectural distortion in the right upper lobe again seen. Volume loss in the right middle lobe. Focal airspace disease in the paramediastinal anterior right middle lobe. Fissural thickening along the minor fissure. Biapical pleuroparenchymal scarring. Small left and trace right pleural effusion. Retained mucus in the upper trachea. Musculoskeletal: Minimal superior endplate compression fracture of T2 and T3, age indeterminate but likely chronic. No suspicious osseous abnormality. CT ABDOMEN PELVIS FINDINGS Hepatobiliary: No focal liver abnormality is seen. No gallstones, gallbladder wall thickening, or biliary dilatation. Pancreas: Mild pancreatic ductal prominence without peripancreatic inflammation or pancreatic mass. Spleen: Normal in size without focal abnormality. Adrenals/Urinary Tract: Normal adrenal glands. No hydronephrosis or perinephric edema. Homogeneous renal enhancement with symmetric excretion on delayed phase  imaging. Urinary bladder is decompressed by Foley catheter. Stomach/Bowel: Bowel evaluation is limited by the absence of enteric contrast and paucity of intra-abdominal fat. Diffuse wall thickening of the gastric fundus. Coarse calcifications versus high-density ingested material in the distal stomach/duodenum, image 70 series 2. Fluid-filled noninflamed small bowel. No evidence of obstruction. High-riding cecum in the mid abdomen. Normal appendix. Moderate colonic stool burden. No colonic wall thickening or inflammatory change. Vascular/Lymphatic: Aorto bi-iliac atherosclerosis. No aneurysm. Prominent periuterine and adnexal vascularity with dilatation of the left ovarian vein at 6 mm. No abdominal or pelvic adenopathy. Reproductive: Prominent periuterine and adnexal vascularity. No adnexal mass. Atrophic uterus. Other: No ascites or free air. Musculoskeletal: There are no acute or suspicious osseous abnormalities. IMPRESSION: 1. Abnormal appearance of the chest with progressive findings from 2013 CT. Progressive central bronchial narrowing with areas of architectural distortion. Development of peribronchovascular nodularity throughout both lungs. Overall findings may be due to atypical infection or inflammatory process. Recommend pulmonary consultation. 2. Hypertrophied small mediastinal vessels of uncertain significance. 3. Stable  ascending thoracic aortic aneurysm of 4 cm. 4. Wall thickening of the gastric fundus, nonspecific for gastritis versus peptic ulcer disease. Coarse calcification versus high-density ingested material in the duodenum. 5. Prominent periuterine vascularity and dilatation of the ovarian veins, suggesting pelvic congestion syndrome. 6.  Aortic Atherosclerosis (ICD10-I70.0). Electronically Signed   By: Narda Rutherford M.D.   On: 04/14/2018 00:08   Dg Chest Portable 1 View  Result Date: 04/13/2018 CLINICAL DATA:  Cough, shortness of breath. EXAM: PORTABLE CHEST 1 VIEW COMPARISON:   07/11/2013 FINDINGS: Unchanged upper normal heart size. Atherosclerosis of the aortic arch. Increased peribronchial thickening from prior exam, possible mild Kerley B-lines. Blunting of the costophrenic angles appears similar to prior. Biapical pleuroparenchymal scarring. Peripheral ill-defined opacities in the right upper and left mid lung zones. No pneumothorax. No acute osseous abnormalities. IMPRESSION: 1. Increased peribronchial thickening from prior exam, bronchitis versus mild pulmonary edema. 2. Ill-defined opacities in the right upper and left mid lung zones, favoring atelectasis or scarring, mild pneumonitis/pneumonia could have a similar appearance. Electronically Signed   By: Narda Rutherford M.D.   On: 04/13/2018 21:59     PERTINENT LAB RESULTS: CBC: Recent Labs    04/13/18 2137 04/15/18 0650  WBC 14.9* 9.2  HGB 13.7 11.0*  HCT 47.1* 37.6  PLT 224 189   CMET CMP     Component Value Date/Time   NA 140 04/15/2018 0650   K 3.1 (L) 04/15/2018 0650   CL 106 04/15/2018 0650   CO2 29 04/15/2018 0650   GLUCOSE 102 (H) 04/15/2018 0650   BUN 6 (L) 04/15/2018 0650   CREATININE 0.60 04/15/2018 0650   CALCIUM 7.9 (L) 04/15/2018 0650   PROT 8.4 (H) 04/13/2018 2137   ALBUMIN 3.2 (L) 04/13/2018 2137   AST 47 (H) 04/13/2018 2137   ALT 31 04/13/2018 2137   ALKPHOS 125 04/13/2018 2137   BILITOT 1.7 (H) 04/13/2018 2137   GFRNONAA >60 04/15/2018 0650   GFRAA >60 04/15/2018 0650    GFR Estimated Creatinine Clearance: 42.9 mL/min (by C-G formula based on SCr of 0.6 mg/dL). No results for input(s): LIPASE, AMYLASE in the last 72 hours. No results for input(s): CKTOTAL, CKMB, CKMBINDEX, TROPONINI in the last 72 hours. Invalid input(s): POCBNP No results for input(s): DDIMER in the last 72 hours. No results for input(s): HGBA1C in the last 72 hours. No results for input(s): CHOL, HDL, LDLCALC, TRIG, CHOLHDL, LDLDIRECT in the last 72 hours. No results for input(s): TSH, T4TOTAL,  T3FREE, THYROIDAB in the last 72 hours.  Invalid input(s): FREET3 No results for input(s): VITAMINB12, FOLATE, FERRITIN, TIBC, IRON, RETICCTPCT in the last 72 hours. Coags: No results for input(s): INR in the last 72 hours.  Invalid input(s): PT Microbiology: Recent Results (from the past 240 hour(s))  Culture, blood (Routine x 2)     Status: None (Preliminary result)   Collection Time: 04/13/18  9:40 PM  Result Value Ref Range Status   Specimen Description   Final    BLOOD RIGHT ANTECUBITAL Performed at [facility], [address] [place], [place] [address]    Special Requests   Final    BOTTLES DRAWN AEROBIC AND ANAEROBIC Blood Culture adequate volume Performed at [facility], [address] [place], [place] [address]    Culture   Final    NO GROWTH 2 DAYS Performed at [facility], [address] [place], [place] [address]    Report Status PENDING  Incomplete  Urine culture     Status: None   Collection Time: 04/13/18  9:51 PM  Result Value Ref Range Status   Specimen Description   Final    URINE, CATHETERIZED Performed at [facility], [address] [place], [place] [address]    Special Requests   Final    NONE Performed at [facility], [address] [place], [place] [address]    Culture   Final    NO GROWTH Performed at [facility], [address] [place] [place], [place] [address]    Report Status 04/15/2018 FINAL  Final  Culture, blood (Routine x 2)     Status: None (Preliminary result)   Collection Time: 04/13/18 10:00 PM  Result Value Ref Range Status   Specimen Description   Final    BLOOD LEFT ANTECUBITAL Performed at [facility], [address] [place], [place] [address]    Special Requests   Final    BOTTLES DRAWN AEROBIC AND ANAEROBIC Blood Culture adequate volume Performed at [facility], [address] [place], [place] [address]    Culture   Final    NO GROWTH 2  DAYS Performed at [facility], [address], [place], [place] [address]    Report Status PENDING  Incomplete  Culture, blood (routine x 2) Call MD if unable to obtain prior to antibiotics being given     Status: None (Preliminary result)   Collection Time: 04/14/18  7:36 PM  Result Value Ref Range Status   Specimen Description BLOOD RIGHT HAND  Final   Special Requests   Final    BOTTLES DRAWN AEROBIC ONLY Blood Culture adequate volume   Culture   Final    NO GROWTH 2 DAYS Performed at [facility], [address], [place], [place] [address]    Report Status PENDING  Incomplete  Culture, blood (routine x 2) Call MD if unable to obtain prior to antibiotics being given     Status: None (Preliminary result)   Collection Time: 04/14/18  7:46 PM  Result Value Ref Range Status   Specimen Description BLOOD LEFT ANTECUBITAL  Final   Special Requests   Final    BOTTLES DRAWN AEROBIC ONLY Blood Culture adequate volume   Culture   Final    NO GROWTH 2 DAYS Performed at [facility], [address], [place], [place] [address]    Report Status PENDING  Incomplete  Respiratory Panel by PCR     Status: None   Collection Time: 04/15/18  7:19 AM  Result Value Ref Range Status   Adenovirus NOT DETECTED NOT DETECTED Final   Coronavirus 229E NOT DETECTED NOT DETECTED Final   Coronavirus HKU1 NOT DETECTED NOT DETECTED Final   Coronavirus NL63 NOT DETECTED NOT DETECTED Final   Coronavirus OC43 NOT DETECTED NOT DETECTED Final   Metapneumovirus NOT DETECTED NOT DETECTED Final   Rhinovirus / Enterovirus NOT DETECTED NOT DETECTED Final   Influenza A NOT DETECTED NOT DETECTED Final   Influenza B NOT DETECTED NOT DETECTED Final   Parainfluenza Virus 1 NOT DETECTED NOT DETECTED Final   Parainfluenza Virus 2 NOT DETECTED NOT DETECTED Final   Parainfluenza Virus 3 NOT DETECTED NOT DETECTED Final   Parainfluenza Virus 4 NOT DETECTED NOT DETECTED Final   Respiratory Syncytial Virus NOT  DETECTED NOT DETECTED Final   Bordetella pertussis NOT DETECTED NOT DETECTED Final   Chlamydophila  pneumoniae NOT DETECTED NOT DETECTED Final   Mycoplasma pneumoniae NOT DETECTED NOT DETECTED Final    Comment: Performed at [facility], [address] [place], [place] [address]    FURTHER DISCHARGE INSTRUCTIONS:  Get Medicines reviewed and adjusted: Please take all your medications with you for your next visit with your Primary MD  Laboratory/radiological data: Please request your Primary MD to go over all hospital tests and procedure/radiological results at the follow up, please ask your Primary MD to get all Hospital records sent to his/her office.  In some cases, they will be blood work, cultures and biopsy results pending at the time of your discharge. Please request that your primary care M.D. goes through all the records of your hospital data and follows up on these results.  Also Note the following: If you experience worsening of your admission symptoms, develop shortness of breath, life threatening emergency, suicidal or homicidal thoughts you must seek medical attention immediately by calling 911 or calling your MD immediately  if symptoms less severe.  You must read complete instructions/literature along with all the possible adverse reactions/side effects for all the Medicines you take and that have been prescribed to you. Take any new Medicines after you have completely understood and accpet all the possible adverse reactions/side effects.   Do not drive when taking Pain medications or sleeping medications (Benzodaizepines)  Do not take more than prescribed Pain, Sleep and Anxiety Medications. It is not advisable to combine anxiety,sleep and pain medications without talking with your primary care practitioner  Special Instructions: If you have smoked or chewed Tobacco  in the last 2 yrs please stop smoking, stop any regular Alcohol  and or any Recreational drug  use.  Wear Seat belts while driving.  Please note: You were cared for by a hospitalist during your hospital stay. Once you are discharged, your primary care physician will handle any further medical issues. Please note that NO REFILLS for any discharge medications will be authorized once you are discharged, as it is imperative that you return to your primary care physician (or establish a relationship with a primary care physician if you do not have one) for your post hospital discharge needs so that they can reassess your need for medications and monitor your lab values.  Total Time spent coordinating discharge including counseling, education and face to face time equals 35 minutes.  SignedJeoffrey Massed: Shanker Ghimire 04/16/2018 9:52 AM

## 2018-04-16 NOTE — Progress Notes (Signed)
PHARMACIST - PHYSICIAN COMMUNICATION DR:   Ghimire CONCERNING: Antibiotic IV to Oral Route Change Policy  RECOMMENDATION: This patient is receiving azithromycin by the intravenous route.  Based on criteria approved by the Pharmacy and Therapeutics Committee, the antibiotic(s) is/are being converted to the equivalent oral dose form(s).   DESCRIPTION: These criteria include: Patient being treated for a respiratory tract infection, urinary tract infection, cellulitis or clostridium difficile associated diarrhea if on metronidazole The patient is not neutropenic and does not exhibit a GI malabsorption state The patient is eating (either orally or via tube) and/or has been taking other orally administered medications for a least 24 hours The patient is improving clinically and has a Tmax < 100.5  If you have questions about this conversion, please contact the Pharmacy Department  []  ( [phone] )  [place] []  ( [phone] )  [facility] [x]  ( [phone]  [place] []  ( [phone]  [facility] []  ( [phone] )  [facility]   

## 2018-04-16 NOTE — Care Management Note (Addendum)
Case Management Note  Patient Details  Name: Tracy Kerr MRN: [id number] Date of Birth: 07-27-1933  Subjective/Objective:       Admitted with bronchitis.           Tracy Kerr (Daughter)     [phone]       Action/Plan: Transition to home with home health services to follow, [facility]. Family states will f/u establishing primary care for pt.  Pt will f/u with Pulmonologist on 04/23/2018 @ 9 am, Angus Seller.  Expected Discharge Date:  04/16/18               Expected Discharge Plan:  Home/Self Care  In-House Referral:  NA  Discharge planning Services  CM Consult  Post Acute Care Choice:  NA Choice offered to:  NA  DME Arranged:  [place] rolling DME Agency:  [facility] will be delivered prior to discharge  HH Arranged:  PT HH Agency:  [facility]  Status of Service:  Completed, signed off  If discussed at Long Length of Stay Meetings, dates discussed:    Additional Comments:  Epifanio Lesches, RN 04/16/2018, 9:37 AM

## 2018-04-17 ENCOUNTER — Inpatient Hospital Stay: Payer: Medicare Other | Admitting: Family Medicine

## 2018-04-19 LAB — CULTURE, BLOOD (ROUTINE X 2)
CULTURE: NO GROWTH
Culture: NO GROWTH
Culture: NO GROWTH
Culture: NO GROWTH
Special Requests: ADEQUATE
Special Requests: ADEQUATE
Special Requests: ADEQUATE
Special Requests: ADEQUATE

## 2018-04-23 ENCOUNTER — Ambulatory Visit (INDEPENDENT_AMBULATORY_CARE_PROVIDER_SITE_OTHER): Payer: Medicare Other | Admitting: Pulmonary Disease

## 2018-04-23 ENCOUNTER — Encounter: Payer: Self-pay | Admitting: Pulmonary Disease

## 2018-04-23 VITALS — BP 102/60 | HR 95 | Temp 98.6°F | Ht 60.0 in | Wt 112.2 lb

## 2018-04-23 DIAGNOSIS — J410 Simple chronic bronchitis: Secondary | ICD-10-CM | POA: Insufficient documentation

## 2018-04-23 DIAGNOSIS — J181 Lobar pneumonia, unspecified organism: Secondary | ICD-10-CM | POA: Diagnosis not present

## 2018-04-23 MED ORDER — AZITHROMYCIN 250 MG PO TABS: ORAL_TABLET | ORAL | 0 refills | Status: AC

## 2018-04-23 MED ORDER — IPRATROPIUM-ALBUTEROL 0.5-2.5 (3) MG/3ML IN SOLN: 3.0000 mL | RESPIRATORY_TRACT | 2 refills | Status: AC | PRN

## 2018-04-23 MED ORDER — AEROCHAMBER MV MISC: 0 refills | Status: AC

## 2018-04-23 MED ORDER — HYDROCHLOROTHIAZIDE 12.5 MG PO CAPS: 12.5000 mg | ORAL_CAPSULE | Freq: Every day | ORAL | 2 refills | Status: DC

## 2018-04-23 MED ORDER — ALBUTEROL SULFATE HFA 108 (90 BASE) MCG/ACT IN AERS: 2.0000 | INHALATION_SPRAY | Freq: Four times a day (QID) | RESPIRATORY_TRACT | 3 refills | Status: AC | PRN

## 2018-04-23 NOTE — Patient Instructions (Signed)
Pneumonia  Abnormal CT Likely secondary to acute pneumonia in setting of chronic lung abnormalities --We will order CT scan in 3 months  Acute on Chronic bronchitis --Zpack --We will order Pulmonary function test at next visit --Albuterol inhaler 2 puffs as needed for shortness of breath and wheezing --START DUONEBS every 4 hours as needed for shortness of breath and wheezing  Hypertension --STOP amlodipine --START HCTZ 12.5 mg daily as needed for SBP >130 --Recommend LOW SODIUM (low salt) diet --Will need to establish care with PCP  Lower extremity edema May be related to medication --STOP amlodipine --Will order TTE if you still have swelling on the next visit   Vim ph? qu?n ma?n tnh Chronic Bronchitis Vim ph? qu?n ma?n tnh l m?t tnh tr?ng vim ke?o di c?a cc ?ng ??a khng khi? vo ph?i c?a quy? vi? (?ng ph? qu?n). ?y l ti?nh tra?ng vim x?y ra:  Va?o h?u h?t cc ngy trong tu?n.  Trong i?t nh?t ba thng m?t l?n.  Trong th?i gian hai n?m lin ti?p. Khi cc ?ng ph? qua?n bi? vim, chu?ng b?t ??u sa?n sinh di?ch nh?y. Ti?nh tra?ng vim v tch t? di?ch nh?y khi?n cho kh th? h?n. Vim ph? qu?n m?n tnh th??ng l v?n ?? lu di. ?y l m?t d?ng c?a b?nh ph?i t?c ngh?n m?n tnh (COPD). Nh?ng ng??i b? vim ph? qu?n m?n tnh d? b? c?m l?nh th??ng xuyn ho?c nhi?m trng ???ng h h?p h?n. Nguyn nhn g gy ra? Vim ph? qu?n ma?n tnh th??ng hay x?y ra nh?t ? nh?ng ng??i:  B? hen suy?n n?ng, m?n tnh.  C ti?n s? hu?t thu?c.  B? hen suy?n v ht thu?c.  B? m?t s? b?nh ph?i nh?t ??nh.  Ti?p xc trong th?i gian di v?i m?t s? lo?i ha ch?t ho?c h?i nh?t ??nh gy kch thch. Cc d?u hi?u ho?c tri?u ch?ng l g? Cc tri?u ch?ng c?a vim ph? qu?n m?n tnh c th? bao g?m:  Ho km [employer] d?ch nh?y (ho c ??m).  Kh th?.  Th? pht ra m thanh l?n (th? kh kh).  C?m gic kh ch?u ? ng?c.  C?m l?nh ho?c nhi?m trng ???ng h h?p th??ng xuyn (ti pht). M?t s?  y?u t? nh?t ??nh c th? lm kh?i pht cc tri?u ch?ng vim ph? qu?n ho?c khi?n cho cc tri?u ch?ng ? tr?m tr?ng h?n, ch?ng h?n:  Nhi?m trng.  D?ng s? d?ng m?t s? lo?i thu?c nh?t ??nh.  Ht thu?c.  Ti?p xc v?i ha ch?t. Ch?n ?on tnh tr?ng ny nh? th? no? Tnh tr?ng ny c th? ???c ch?n ?on d?a vo:  Tri?u ch?ng v b?nh s? c?a qu v?.  Khm th?c th?.  Ch?p X-quang ng?c.  Ki?m tra ch?c n?ng ph?i (ph?i). Tnh tr?ng ny ???c ?i?u tr? nh? th? no? Khng c cch ?i?u tr? kh?i vim ph? qu?n m?n tnh, nh?ng vi?c ?i?u tr? c th? gip ki?m sot cc tri?u ch?ng c?a qu v?. ?i?u tr? c th? bao g?m:  S? d?ng bnh phun h?i n??c mt ho?c my ?i?u ?m ?? lm cho qu v? th? d? dng h?n.  U?ng nhi?u n??c h?n. U?ng nhi?u n??c h?n gip d?ch nh?y long h?n, c th? lm cho qu v? th? d? dng h?n.  Thay ??i l?i s?ng, ch?ng h?n ?n ch? ?? ?n lnh m?nh h?n v t?p th? d?c nhi?u h?n.  Thu?c, ch?ng h?n nh?: ? ?ng thu?c ht ?? c?i thi?n do?ng khng kh vo v ra kh?i ph?i c?a  quy? vi?. ? Cc thu?c khng sinh ?? ?i?u tr? b?t c? nhi?m khu?n no m qu v? b?, ch?ng h?n nh?:  Nhi?m trng ph?i (vim ph?i).  Nhi?m trng xoang.  ??t vim ph? qu?n n?ng, ??t ng?t (c?p tnh).  Li?u php  xi.  Phng ng?a nhi?m trng b?ng cch tim v?c-xin ??y ??, g?m c? v?c xin cm v v?c xin vim ph?i.  Ph?c h?i ch?c n?ng ph?i. ?y l ch??ng trnh gip qu v? x? tr cc v?n ?? th? v c?i thi?n ch?t l??ng cu?c s?ng c?a mnh. Ch??ng trnh ny c th? ko di ln ??n 4-12 tu?n v c th? bao g?m cc ch??ng trnh luy?n t?p, gio d?c, t? v?n v h? tr? ?i?u tr?Tracy Kerr. Tun th? nh?ng h??ng d?n ny ? nh: Thu?c  Ch? s? d?ng thu?c khng k ??n v thu?c k ??n theo ch? d?n c?a chuyn gia ch?m [place] s?c kh?e.  N?u qu v? ???c k thu?c khng sinh, hy dng thu?c theo ch? d?n c?a chuyn gia ch?m [place] s?c kh?e. Khng d?ng u?ng thu?c khng sinh ngay c? khi qu v? b?t ??u c?m th?y ?? h?n. Phng ng?a nhi?m trng  Tim cc lo?i v?c xin theo  khuy?n ngh? c?a chuyn gia ch?m [place] s?c kh?e. ??o b?o r?ng qu v? tim phng cm (v?c xin cm) hng n?m.  R?a tay th???ng xuyn b?ng x phng v n??c. N?u khng c x phng v n??c, hy dng thu?c st trng tay.  Trnh ti?p xc v?i nh?ng ng??i c cc tri?u ch?ng c?m l?nh ho?c cm. X? tr cc tri?u ch?ng   Khng ht thu?c v trnh khi thu?c th? ??ng. Ti?p xc v?i khi thu?c l ho?c cc ha ch?t kch thch s? lm cho vim ph? qu?n tr?m tr?ng h?n. N?u qu v? ht thu?c v c?n gip ?? ?? b? thu?c l, hy h?i chuyn gia ch?m [place] s?c kh?e. B? ht thu?c s? gip ph?i bnh ph?c nhanh h?n.  S? d?ng my kh dung, bnh phun h?i n??c mt ho?c my ?i?u ?m theo ch? d?n c?a chuyn gia ch?m [place] s?c kh?e.  Trnh ph?n hoa, b?i, va?y da ??ng v?t, n?m m?c, khi v nh?ng th? khc gy cc c?n kh th? ho?c th? kh kh.  S? d?ng li?u php  xi t?i nh theo ch? d?n. Lm theo h??ng d?n c?a chuyn gia ch?m [place] s?c kh?e v? cch s? d?ng  xi an ton v c?n tr?ng ?? phng ng?a h?a ho?n. ??m b?o r?ng qu v? khng bao gi? ht thu?c khi s? d?ng  xi ho?c khng cho php ng??i khc ht thu?c t?i nh qu v?.  Khng ??i ?? ???c khm n?u quy? vi? c b?t k? tri?u ch?ng gy lo nga?i na?o ho?c b? kh th?. Ch? ??i c th? gy t?n th??ng v?nh vi?n v c th? ?e do?a ma?ng s?ng. H??ng d?n chung  Trao ??i v?i chuyn gia ch?m [place] s?c kh?e c?a qu v? v? cc ho?t ??ng an ton cho qu v? v v? nh?ng l? trnh luy?n t?p kh? thi. T?p th? d?c ??u ??n l r?t quan tr?ng ?? gip quy? vi? c?m th?y ?? h?n.  U?ng ?? n??c ?? gi? cho n??c ti?u c mu vng nh?t.  Tun th? t?t c? cc l?n khm theo di theo ch? d?n c?a chuyn gia ch?m [place] s?c kh?e. ?i?u ny c vai tr quan tr?ng. Hy lin l?c v?i chuyn gia ch?m [place] s?c kh?e n?u:  Qu v? b? ho ho?c kh th?  tr?m tr?ng h?n.  Qu v? b? ?au nh?c c?.  Qu v? b? ?au ng?c.  D?ch nh?y c?a qu v? d??ng nh? ??c h?n.  Di?ch nh?y c?a qu v? thay ??i t? trong ho?c mu tr?ng sang mu vng, xanh, xm ho?c c  mu. Yu c?u tr? gip ngay l?p t?c n?u:  Cc lo?i thu?c thng th??ng c?a quy? vi? khng la?m quy? vi? h?t th?? kho? khe?.  Qu v? r?t kh th?. Nh?ng tri?u ch?ng ny c th? l bi?u hi?n c?a m?t v?n ?? nghim tr?ng c?n c?p c?u. Khng ch? xem tri?u ch?ng c h?t khng. Hy ?i khm ngay l?p t?c. G?i cho d?ch v? c?p c?u t?i ??a ph??ng (911 ? [place] Khng t? li xe ??n b?nh vi?n. Tm t?t  Vim ph? qu?n ma?n tnh l m?t tnh tr?ng vim ke?o di c?a cc ?ng ??a khng khi? vo ph?i c?a quy? vi? (?ng ph? qu?n).  Vim ph? qu?n m?n tnh th??ng l v?n ?? lu di. ?y l m?t d?ng c?a b?nh ph?i t?c ngh?n m?n tnh (COPD).  Khng c cch ?i?u tr? kh?i vim ph? qu?n m?n tnh, nh?ng vi?c ?i?u tr? c th? gip ki?m sot cc tri?u ch?ng c?a qu v?.  Khng ht thu?c v trnh khi thu?c th? ??ng. Ti?p xc v?i khi thu?c l ho?c cc ha ch?t kch thch s? lm cho vim ph? qu?n tr?m tr?ng h?n. Thng tin ny khng nh?m m?c ?ch thay th? cho l?i khuyn m chuyn gia ch?m [place] s?c kh?e ni v?i qu v?. Hy b?o ??m qu v? ph?i th?o lu?n b?t k? v?n ?? g m qu v? c v?i chuyn gia ch?m  s?c kh?e c?a qu v?. Document Released: 07/17/2015 Document Revised: 04/02/2017 Document Reviewed: 04/02/2017 [employer] Interactive Patient Education  2019 [employer]

## 2018-04-23 NOTE — Progress Notes (Signed)
Synopsis: Referred in 04/2018 for abnormal CT  Subjective:   PATIENT ID: Tracy Kerr GENDER: female DOB: 19-Sep-1933, MRN: [id number]   HPI  Chief Complaint  Patient presents with  . Consult    follow up from hospital - pneumonia/sepsis - a little cough mildly productive   Tracy Kerr is a 85 year old female never smoker with hypertension who presents for hospital follow-up for pneumonia. Family present and provided translation and history. Patient's primary language is [place]   On review of discharge summary: She was recently hospitalized from 1/7 to 1/9 for sepsis secondary to community-acquired pneumonia treated with ceftriaxone and azithromycin.  RVP and cultures negative.  CT chest with progressive central bronchial narrowing and peribronchial vascular nodularity to be secondary to infection or inflammation per radiology read.  Pulmonary evaluation was recommended.  Since discharge she reports feeling overall improved however has persistent shortness of breath andcough that is sometimes productive. When she is able to produce sputum, she has relief with her breathing but when she cannot, she feels like it is caught in her throat or chest. Her symptoms worsen with activity and improves with rest. When she was in the hospital, she felt the nebulizer as helped.  Denies recent fevers or chills, chest pain, hemoptysis.  Of note, she reports that since starting amlodipine she complains of flushing, fatigue and lower extremity edema. During these times, she will check her blood pressure and notice that the top number can be low in the 80s or 90s. However when she does not take the medication her blood pressure will measure >130-140. Denies headaches, dizziness, syncope.  Social History: Never smoker  Environmental exposures: No known exposures  I have personally reviewed patient's past medical/family/social history, allergies, current medications.  Past Medical History:  Diagnosis Date   . Hypertension   . Noncompliance with medications      History reviewed. No pertinent family history.   Social History   Occupational History  . Not on file  Tobacco Use  . Smoking status: Never Smoker  Substance and Sexual Activity  . Alcohol use: No  . Drug use: No  . Sexual activity: Not Currently    No Known Allergies   Outpatient Medications Prior to Visit  Medication Sig Dispense Refill  . acetaminophen (TYLENOL) 500 MG tablet Take 1 tablet (500 mg total) by mouth every 6 (six) hours as needed for mild pain or fever. 30 tablet 0  . amLODipine (NORVASC) 5 MG tablet Take 1 tablet (5 mg total) by mouth daily. 30 tablet 0  . amoxicillin-clavulanate (AUGMENTIN) 875-125 MG tablet Take 1 tablet by mouth 2 (two) times daily. 10 tablet 0   No facility-administered medications prior to visit.     Review of Systems  Constitutional: Positive for malaise/fatigue. Negative for chills, diaphoresis, fever and weight loss.  HENT: Positive for congestion. Negative for sore throat.   Respiratory: Positive for cough, sputum production and shortness of breath. Negative for hemoptysis and wheezing.   Cardiovascular: Positive for leg swelling. Negative for chest pain, palpitations, orthopnea and PND.  Gastrointestinal: Negative for abdominal pain, heartburn and nausea.  Genitourinary: Negative for frequency.  Musculoskeletal: Negative for myalgias.  Skin: Negative for rash.  Neurological: Positive for weakness. Negative for dizziness, focal weakness and headaches.  Endo/Heme/Allergies: Does not bruise/bleed easily.     Objective:   Vitals:   04/23/18 0918  BP: 102/60  Pulse: 95  Temp: 98.6 F (37 C)  SpO2: 94%  Weight: 112 lb 3.2 oz (50.9  kg)  Height: 5' (1.524 m)   SpO2: 94 % O2 Device: None (Room air)  Physical Exam General: Elderly female, no acute distress HENT: [place], AT, OP clear, MMM Eyes: EOMI, no scleral icterus Respiratory: Mild rhonchi with occasional  wheeze Cardiovascular: RRR, -M/R/G, no JVD GI: BS+, soft, nontender Extremities:1+ pitting edemaNeuro: AAO x4, CNII-XII grossly intact Skin: Intact, no rashes or bruising Psych: Normal mood, normal affect  Chest imaging: CT 04/13/2018-diffuse bronchial and septal thickening associated with groundglass opacities who peribronchial nodularity.  Some bronchial narrowing seen in right upper lobe with atelectasis  PFT: None on file  Imaging, labs and test noted above have been reviewed independently by me.    Assessment & Plan:   Simple chronic bronchitis Hypertension  Discussion: 85 year old female with hypertension who presents as a new patient for hospital follow-up for recent pneumonia and abnormal CT.  Pneumonia  Abnormal CT Likely secondary to acute pneumonia in setting of chronic lung abnormalities --We will order CT scan in 3 months  Acute on Chronic bronchitis --Zpack --We will order Pulmonary function test at next visit --Albuterol inhaler 2 puffs as needed for shortness of breath and wheezing --START DUONEBS every 4 hours as needed for shortness of breath and wheezing --We will order nebulizer  Hypertension --STOP amlodipine --START HCTZ 12.5 mg daily as needed for SBP >130 --Recommend LOW SODIUM (low salt) diet --Will need to establish care with PCP  Lower extremity edema May be related to medication --STOP amlodipine --Will order TTE if you still have swelling on the next visit   Orders Placed This Encounter  Procedures  . CT Chest Wo Contrast    Standing Status:   Future    Standing Expiration Date:   06/22/2019    Scheduling Instructions:     CT chest April 2020 for pneumonia    Order Specific Question:   ** REASON FOR EXAM (FREE TEXT)    Answer:   pneumonia    Order Specific Question:   Preferred imaging location?    Answer:   Adult nurse CT - [facility]    Order Specific Question:   Radiology Contrast Protocol - do NOT remove file path    Answer:    [email or web address]  . AMB REFERRAL FOR DME    Referral Priority:   Routine    Referral Type:   Durable Medical Equipment Purchase    Number of Visits Requested:   1  . Pulmonary Function Test    Standing Status:   Future    Standing Expiration Date:   04/24/2019    Scheduling Instructions:     Schedule with next OV 3 months    Order Specific Question:   Where should this test be performed?    Answer:   [facility]    Order Specific Question:   Full PFT: includes the following: basic spirometry, spirometry pre & post bronchodilator, diffusion capacity (DLCO), lung volumes    Answer:   Full PFT    Order Specific Question:   MIP/MEP    Answer:   No    Order Specific Question:   6 minute walk    Answer:   No    Order Specific Question:   ABG    Answer:   No    Order Specific Question:   Diffusion capacity (DLCO)    Answer:   Yes    Order Specific Question:   Lung volumes    Answer:   Yes    Order Specific Question:  Methacholine challenge    Answer:   No   Meds ordered this encounter  Medications  . ipratropium-albuterol (DUONEB) 0.5-2.5 (3) MG/3ML SOLN    Sig: Take 3 mLs by nebulization every 4 (four) hours as needed.    Dispense:  75 mL    Refill:  2  . hydrochlorothiazide (MICROZIDE) 12.5 MG capsule    Sig: Take 1 capsule (12.5 mg total) by mouth daily.    Dispense:  30 capsule    Refill:  2  . azithromycin (ZITHROMAX Z-PAK) 250 MG tablet    Sig: As directed    Dispense:  6 each    Refill:  0  . albuterol (PROVENTIL HFA;VENTOLIN HFA) 108 (90 Base) MCG/ACT inhaler    Sig: Inhale 2 puffs into the lungs every 6 (six) hours as needed for wheezing or shortness of breath.    Dispense:  1 Inhaler    Refill:  3  . Spacer/Aero-Holding Chambers (AEROCHAMBER MV) inhaler    Sig: Use as instructed    Dispense:  1 each    Refill:  0    No follow-ups on file.  Chi Mechele CollinJane Ellison, MD [facility] 04/23/2018 12:47 PM  Personal  pager: [phone] If unanswered, please page CCM On-call: #[phone]

## 2018-04-27 DIAGNOSIS — J410 Simple chronic bronchitis: Secondary | ICD-10-CM | POA: Diagnosis not present

## 2018-05-16 ENCOUNTER — Other Ambulatory Visit: Payer: Self-pay | Admitting: Pulmonary Disease

## 2018-05-18 NOTE — Telephone Encounter (Signed)
[facility] called to request Hydrochlorothiazide 12.5mg  changed to dispense #90 instead of #30 Caps due to patient's insurance. Change was made to order #90 with 1 refill.  Nothing further needed at this time.

## 2018-05-28 DIAGNOSIS — J410 Simple chronic bronchitis: Secondary | ICD-10-CM | POA: Diagnosis not present

## 2018-06-26 DIAGNOSIS — J410 Simple chronic bronchitis: Secondary | ICD-10-CM | POA: Diagnosis not present

## 2018-07-17 ENCOUNTER — Inpatient Hospital Stay: Admission: RE | Admit: 2018-07-17 | Payer: Medicare Other | Source: Ambulatory Visit

## 2018-07-20 ENCOUNTER — Ambulatory Visit: Payer: Medicare Other | Admitting: Pulmonary Disease

## 2018-07-27 DIAGNOSIS — J410 Simple chronic bronchitis: Secondary | ICD-10-CM | POA: Diagnosis not present

## 2018-08-26 DIAGNOSIS — J410 Simple chronic bronchitis: Secondary | ICD-10-CM | POA: Diagnosis not present

## 2018-09-22 ENCOUNTER — Telehealth: Payer: Self-pay | Admitting: *Deleted

## 2018-09-22 NOTE — Telephone Encounter (Signed)
-----   Message from Osvaldo Shipper, [place] sent at 09/22/2018 12:13 PM EDT ----- Regarding: CT FYI Triage   I called pt to confirm CT appt for 6/17 per granddaughter Ms. Gonzales does not want to come have CT at his time because she feels fine. They will call with any changes.   Thanks   [employer]

## 2018-09-22 NOTE — Telephone Encounter (Signed)
FYI: CT scan Spoke with patient's daughter and patient is having trouble with foot pain and can not walk much right now. They decided to cancel CT scan. They will call back later if they decide to reschedule. Per family her breathing is doing well.

## 2018-09-23 ENCOUNTER — Inpatient Hospital Stay: Admission: RE | Admit: 2018-09-23 | Payer: Medicare Other | Source: Ambulatory Visit

## 2018-09-26 DIAGNOSIS — J410 Simple chronic bronchitis: Secondary | ICD-10-CM | POA: Diagnosis not present

## 2018-10-26 DIAGNOSIS — J410 Simple chronic bronchitis: Secondary | ICD-10-CM | POA: Diagnosis not present

## 2018-11-09 ENCOUNTER — Other Ambulatory Visit: Payer: Self-pay | Admitting: Pulmonary Disease

## 2018-11-26 DIAGNOSIS — J410 Simple chronic bronchitis: Secondary | ICD-10-CM | POA: Diagnosis not present

## 2018-12-27 DIAGNOSIS — J410 Simple chronic bronchitis: Secondary | ICD-10-CM | POA: Diagnosis not present

## 2019-01-26 DIAGNOSIS — J410 Simple chronic bronchitis: Secondary | ICD-10-CM | POA: Diagnosis not present

## 2019-02-01 ENCOUNTER — Other Ambulatory Visit: Payer: Self-pay | Admitting: Pulmonary Disease

## 2019-02-26 DIAGNOSIS — J410 Simple chronic bronchitis: Secondary | ICD-10-CM | POA: Diagnosis not present

## 2019-03-28 DIAGNOSIS — J410 Simple chronic bronchitis: Secondary | ICD-10-CM | POA: Diagnosis not present

## 2019-04-28 DIAGNOSIS — J410 Simple chronic bronchitis: Secondary | ICD-10-CM | POA: Diagnosis not present

## 2019-05-29 DIAGNOSIS — J410 Simple chronic bronchitis: Secondary | ICD-10-CM | POA: Diagnosis not present

## 2019-06-08 ENCOUNTER — Telehealth: Payer: Self-pay | Admitting: Pulmonary Disease

## 2019-06-17 ENCOUNTER — Telehealth: Payer: Self-pay | Admitting: Pulmonary Disease

## 2019-06-17 NOTE — Telephone Encounter (Signed)
ATC Patient's daughter, Avanell Shackleton x's 2 . No answer, no VM. Will try call again.

## 2019-06-18 NOTE — Telephone Encounter (Signed)
We have not seen the pt since January 2020. Per our records, we have not prescribed anything for the since then. ATC pt's daughter, there was no answer and I could not leave a message. Will try back.

## 2019-06-18 NOTE — Telephone Encounter (Signed)
lmtcb X1 for pt to make aware of JE's recs. Will route to nurse for follow-up as this is not a triage generated message.

## 2019-06-18 NOTE — Telephone Encounter (Signed)
Patient previously called for HCTZ refill which we cannot provide. However she is due for CT scan, please contact patient regarding this and if she wishes to be seen pulmonary clinic since it has been >1 year since the last visit.  Staff, please call to schedule follow-up visit with me.  Mechele Collin, M.D. [facility] Pulmonary/Critical Care Medicine 06/18/2019 4:20 PM

## 2019-06-22 ENCOUNTER — Other Ambulatory Visit: Payer: Self-pay | Admitting: Pulmonary Disease

## 2019-06-22 NOTE — Telephone Encounter (Signed)
I have attempted to call patient twice. I have reached daughter/granddaughter and advised her to have patient call our office back to schedule appointment.  Her last visit with me >1 year. Patient is requesting an anti-hypertensive medication which needs to be filled by her PCP. However, she still needs to be seen by Pulmonary clinic to order a repeat CT Chest for abnormal findings noted on prior imaging.  If patient returns call, please schedule clinic visit with me or NP.  Mechele Collin, M.D. [facility] Pulmonary/Critical Care Medicine 06/22/2019 2:26 PM

## 2019-06-22 NOTE — Telephone Encounter (Signed)
Dr. Everardo All, please advise if you are okay with med being refilled.

## 2019-06-22 NOTE — Telephone Encounter (Signed)
ATC pt's daughter, there was no answer and I could not leave a message. We have attempted to contact pt's daughter several times with no success or call back from her. Per triage protocol, message will be closed.

## 2019-06-26 DIAGNOSIS — J410 Simple chronic bronchitis: Secondary | ICD-10-CM | POA: Diagnosis not present

## 2019-06-29 NOTE — Telephone Encounter (Signed)
atc patient with interpretors unable to reach left message to call back

## 2019-07-14 ENCOUNTER — Encounter: Payer: Self-pay | Admitting: *Deleted

## 2019-07-14 NOTE — Telephone Encounter (Signed)
Will send letter for patient to call office for results LMTCBx3 per inetrpereter

## 2020-01-21 ENCOUNTER — Other Ambulatory Visit: Payer: Self-pay | Admitting: Pulmonary Disease

## 2020-01-22 ENCOUNTER — Telehealth: Payer: Self-pay | Admitting: Pulmonary Disease

## 2020-01-22 ENCOUNTER — Other Ambulatory Visit: Payer: Self-pay | Admitting: Pulmonary Disease

## 2020-01-22 NOTE — Telephone Encounter (Signed)
Patient's son called to ask refills of pain and BP meds. He does not know the name of the meds.  As per the clinic notes and last note from Everardo All the patient needs to be seen in clinic as the last visit was Jan 2020  Son is also asking for paperwork to be filled so he can get leave to take care of mother. I advised him to call back Monday to make appointment and will send message to Dr. Everardo All.  Chilton Greathouse MD [facility] 01/22/2020, 3:18 PM

## 2020-01-24 ENCOUNTER — Telehealth: Payer: Self-pay | Admitting: Pulmonary Disease

## 2020-01-24 NOTE — Telephone Encounter (Signed)
ATC patient to advise she needs an OV to refill her proair inhaler, no visit since January 2020.  No VM.

## 2020-01-25 ENCOUNTER — Other Ambulatory Visit: Payer: Self-pay | Admitting: Pulmonary Disease

## 2020-01-27 NOTE — Telephone Encounter (Signed)
I have made multiple phone calls and unable to leave a message due to voicemail being full. Of note, our pulmonary office has not prescribed pain medications for this patient in the past. She has also not been seen in our clinic since 04/23/2018. If patient or family calls back, she will need an appointment with me first. Please be clear however that we do not normally prescribe pain medications and that this will need to be discussed with her PCP.  Mechele Collin, M.D. [facility] Pulmonary/Critical Care Medicine 01/27/2020 5:03 PM

## 2020-01-27 NOTE — Telephone Encounter (Signed)
No DPR on file, and daughter is listed as emergency contact. lmtcb X1 for pt.  Pt will need to be scheduled for an appt with Dr. Everardo All when she calls back.

## 2020-01-31 DIAGNOSIS — Z1159 Encounter for screening for other viral diseases: Secondary | ICD-10-CM | POA: Diagnosis not present

## 2020-01-31 DIAGNOSIS — R5383 Other fatigue: Secondary | ICD-10-CM | POA: Diagnosis not present

## 2020-01-31 DIAGNOSIS — R1084 Generalized abdominal pain: Secondary | ICD-10-CM | POA: Diagnosis not present

## 2020-01-31 DIAGNOSIS — Z20822 Contact with and (suspected) exposure to covid-19: Secondary | ICD-10-CM | POA: Diagnosis not present

## 2020-01-31 DIAGNOSIS — R634 Abnormal weight loss: Secondary | ICD-10-CM | POA: Diagnosis not present

## 2020-02-27 DIAGNOSIS — Z20822 Contact with and (suspected) exposure to covid-19: Secondary | ICD-10-CM | POA: Diagnosis not present

## 2020-05-30 IMAGING — CT CT CHEST W/ CM
2 of 5 series · 12 of 36 positions shown, 15 images · IV contrast (APPLIED)
Comparison: Chest radiograph earlier this day.  Chest CT 12/16/2011

CLINICAL DATA: Acute resp illness, > 40 years old; Abd pain, acute,
generalized.

EXAM:
CT CHEST, ABDOMEN, AND PELVIS WITH CONTRAST
TECHNIQUE: Multidetector CT imaging of the chest, abdomen and pelvis was
performed following the standard protocol during bolus
administration of intravenous contrast.
CONTRAST:  100mL 6ZCRG9-S33 IOPAMIDOL (6ZCRG9-S33) INJECTION 61%

[Series 2: cap with 2 · axial · 0.65mm/px · z∈[-551,-66]mm · 9 of 123 slices shown, 12 images]
[im 13/123  mediastinal]
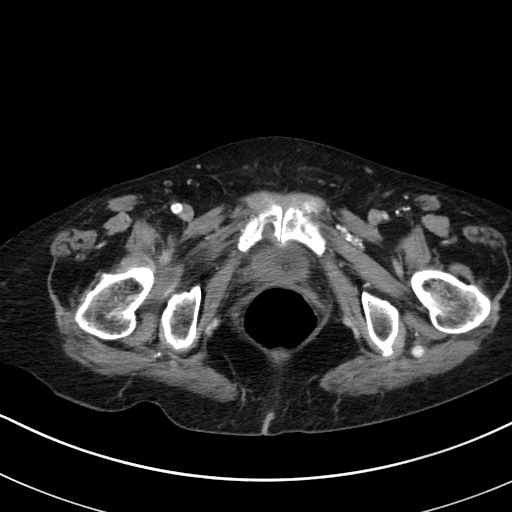
[im 13/123  lung]
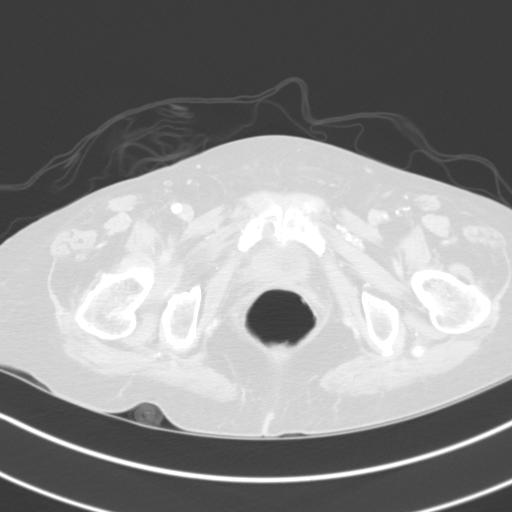
[im 25/123  lung]
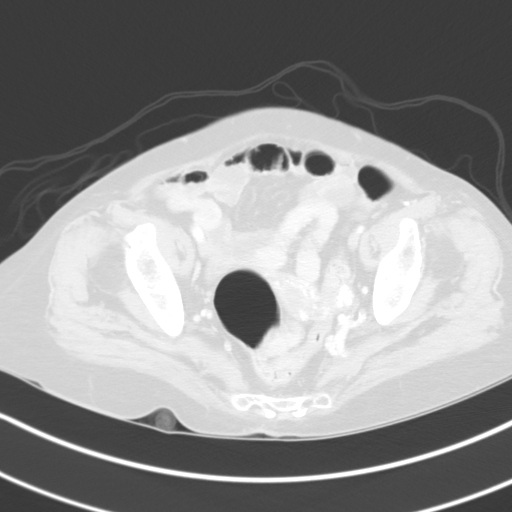
[im 37/123  lung]
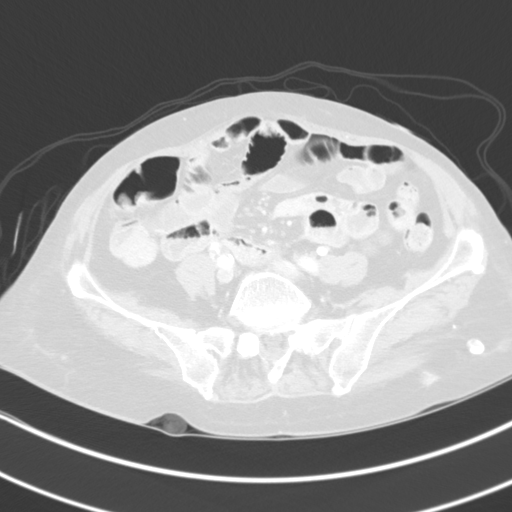
[im 49/123  lung]
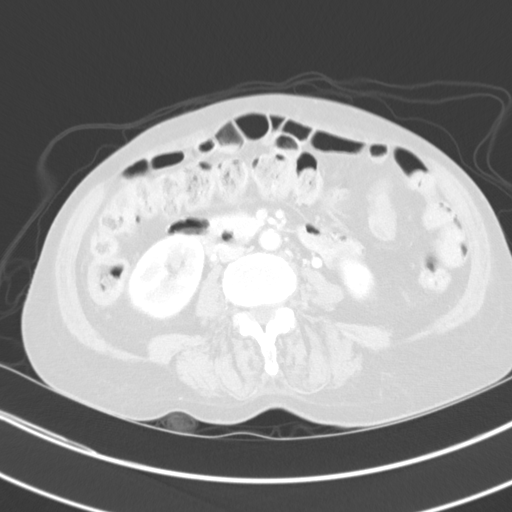
[im 62/123  mediastinal]
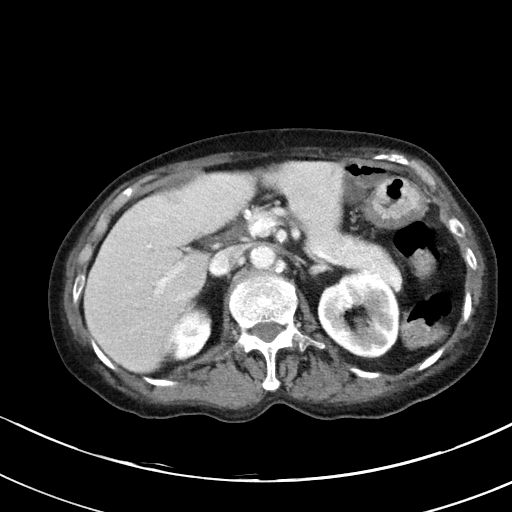
[im 62/123  lung]
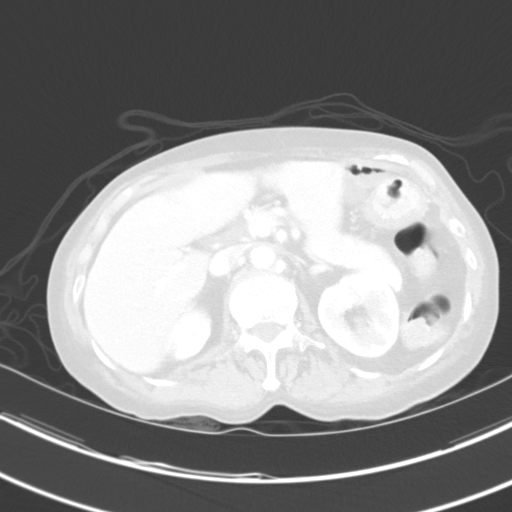
[im 74/123  lung]
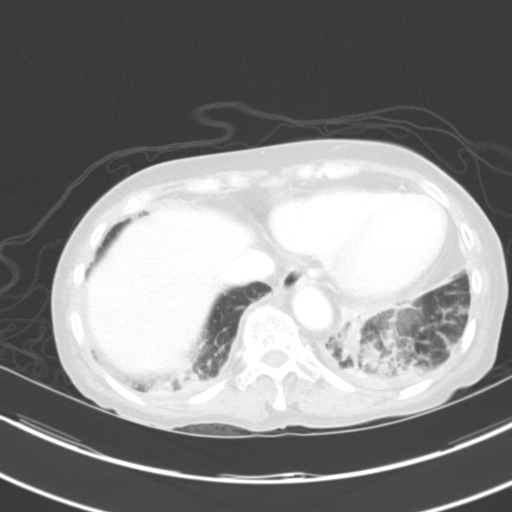
[im 86/123  lung]
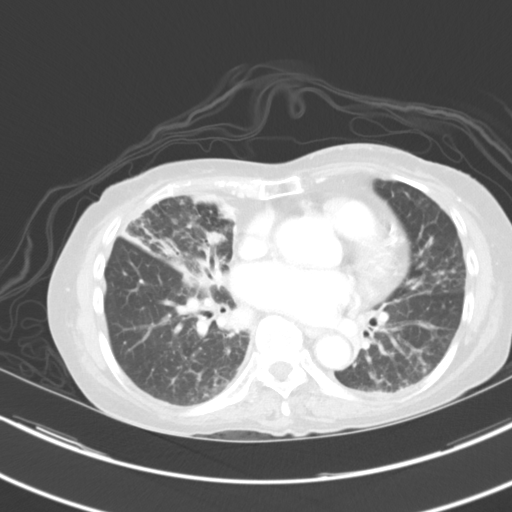
[im 98/123  lung]
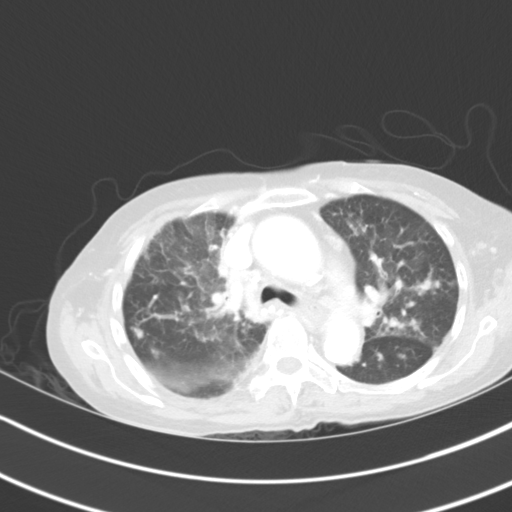
[im 110/123  mediastinal]
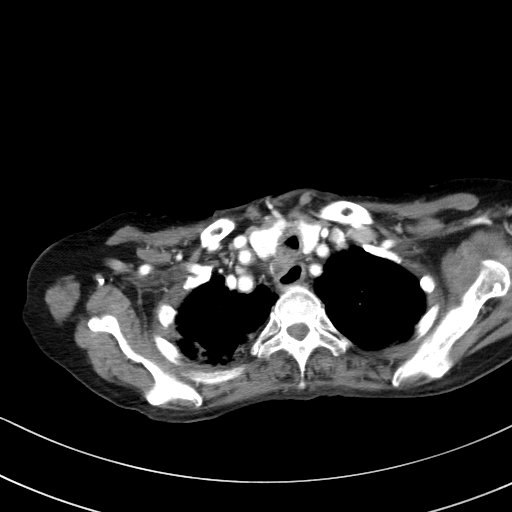
[im 110/123  lung]
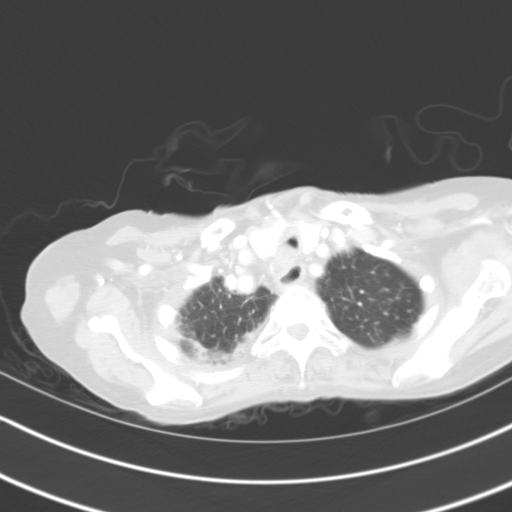

[Series 4: coronals · coronal · 1.01mm/px · 3 of 101 slices shown]
[im 21/101  lung]
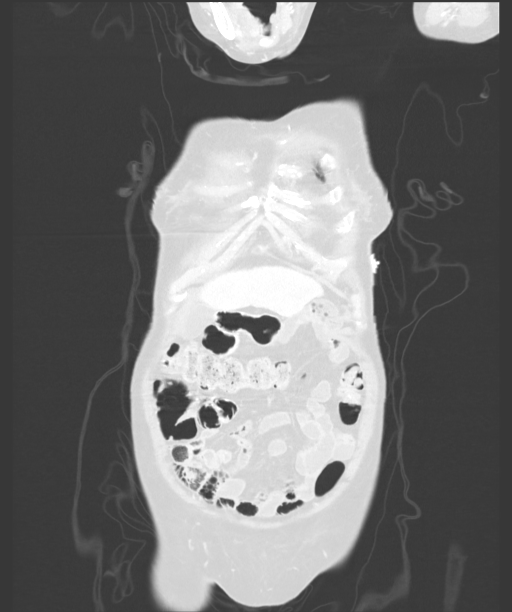
[im 41/101  lung]
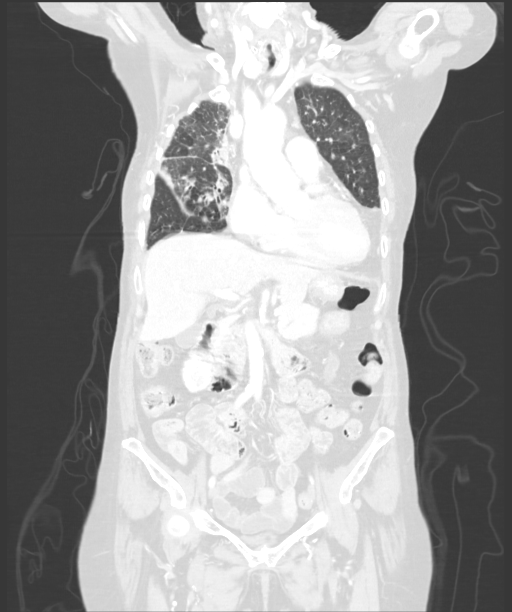
[im 61/101  lung]
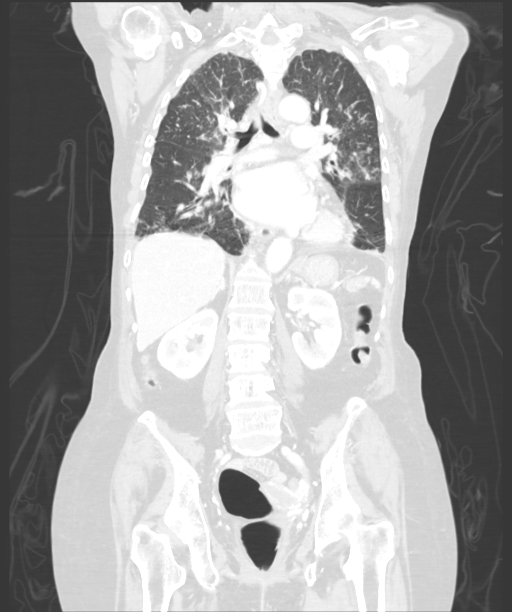

[12 of 36 positions shown; findings below may reference images not displayed]

FINDINGS: CT CHEST FINDINGS

Cardiovascular: Ectasia of the ascending thoracic aorta at 4 cm,
unchanged. Mild aortic atherosclerosis. No dissection. There is an
aberrant small branch vessel from the proximal aspect of the
descending aorta with serpiginous mediastinal collaterals extending
to the subcarinal region and both hila, right greater than left.
Vessel hypertrophy since prior exam, with numerous small perihilar
and subcarinal vessels.. No filling defects in the central pulmonary
arteries to suggest pulmonary embolus. Mild cardiomegaly. There are
coronary artery calcifications. No pericardial effusion. The IVC is
patent.

Mediastinum/Nodes: Multiple small reactive appearing lymph nodes
throughout the mediastinum, all subcentimeter. Patulous esophagus as
before. No dominant thyroid nodule.

Lungs/Pleura: Central bronchial narrowing appears most severe
involving the right upper lobe. There is associated bronchial
thickening, multifocal septal thickening, ground-glass opacity, and
peribronchovascular nodularity. Areas of architectural distortion in
the right upper lobe again seen. Volume loss in the right middle
lobe. Focal airspace disease in the paramediastinal anterior right
middle lobe. Fissural thickening along the minor fissure. Biapical
pleuroparenchymal scarring. Small left and trace right pleural
effusion. Retained mucus in the upper trachea.

Musculoskeletal: Minimal superior endplate compression fracture of
T2 and T3, age indeterminate but likely chronic. No suspicious
osseous abnormality.

CT ABDOMEN PELVIS FINDINGS

Hepatobiliary: No focal liver abnormality is seen. No gallstones,
gallbladder wall thickening, or biliary dilatation.

Pancreas: Mild pancreatic ductal prominence without peripancreatic
inflammation or pancreatic mass.

Spleen: Normal in size without focal abnormality.

Adrenals/Urinary Tract: Normal adrenal glands. No hydronephrosis or
perinephric edema. Homogeneous renal enhancement with symmetric
excretion on delayed phase imaging. Urinary bladder is decompressed
by Foley catheter.

Stomach/Bowel: Bowel evaluation is limited by the absence of enteric
contrast and paucity of intra-abdominal fat. Diffuse wall thickening
of the gastric fundus. Coarse calcifications versus high-density
ingested material in the distal stomach/duodenum, image 70 series 2.
Fluid-filled noninflamed small bowel. No evidence of obstruction.
High-riding cecum in the mid abdomen. Normal appendix. Moderate
colonic stool burden. No colonic wall thickening or inflammatory
change.

Vascular/Lymphatic: Aorto bi-iliac atherosclerosis. No aneurysm.
Prominent periuterine and adnexal vascularity with dilatation of the
left ovarian vein at 6 mm. No abdominal or pelvic adenopathy.

Reproductive: Prominent periuterine and adnexal vascularity. No
adnexal mass. Atrophic uterus.

Other: No ascites or free air.

Musculoskeletal: There are no acute or suspicious osseous
abnormalities.
IMPRESSION: 1. Abnormal appearance of the chest with progressive findings from
6884 CT. Progressive central bronchial narrowing with areas of
architectural distortion. Development of peribronchovascular
nodularity throughout both lungs. Overall findings may be due to
atypical infection or inflammatory process. Recommend pulmonary
consultation.
2. Hypertrophied small mediastinal vessels of uncertain
significance.
3. Stable ascending thoracic aortic aneurysm of 4 cm.
4. Wall thickening of the gastric fundus, nonspecific for gastritis
versus peptic ulcer disease. Coarse calcification versus
high-density ingested material in the duodenum.
5. Prominent periuterine vascularity and dilatation of the ovarian
veins, suggesting pelvic congestion syndrome.
6.  Aortic Atherosclerosis ([id number]-FKN.N).

## 2022-11-07 DEATH — deceased
